# Patient Record
Sex: Male | Born: 1975
Health system: Southern US, Community
[De-identification: ages and names within clinical notes are randomized; demographics above are authoritative.]

## PROBLEM LIST (undated history)

## (undated) DIAGNOSIS — I10 Essential (primary) hypertension: Secondary | ICD-10-CM

## (undated) HISTORY — PX: TESTICLE REMOVAL: SHX68

## (undated) HISTORY — PX: HERNIA REPAIR: SHX51

---

## 2005-10-16 ENCOUNTER — Emergency Department (HOSPITAL_COMMUNITY): Admission: EM | Admit: 2005-10-16 | Discharge: 2005-10-16 | Payer: Self-pay | Admitting: Emergency Medicine

## 2006-10-07 ENCOUNTER — Emergency Department (HOSPITAL_COMMUNITY): Admission: EM | Admit: 2006-10-07 | Discharge: 2006-10-07 | Payer: Self-pay | Admitting: Emergency Medicine

## 2008-12-15 ENCOUNTER — Emergency Department (HOSPITAL_COMMUNITY): Admission: EM | Admit: 2008-12-15 | Discharge: 2008-12-15 | Payer: Self-pay | Admitting: Emergency Medicine

## 2010-06-05 IMAGING — CT CT PELVIS W/O CM
2 of 4 series · 14 of 32 positions shown, 19 images · non-contrast
Comparison: None

CT ABDOMEN

CLINICAL DATA: Hematuria, left flank pain

CT OF THE ABDOMEN AND PELVIS WITHOUT CONTRAST (CT UROGRAM)
TECHNIQUE: Multidetector CT imaging was performed through the
abdomen and pelvis to include the urinary tract.

[Series 2: stone <(id) w/ a & p 12 mth · axial · 0.75mm/px · z∈[-353,-18]mm · 7 of 88 slices shown, 12 images]
[im 11/88  soft-tissue]
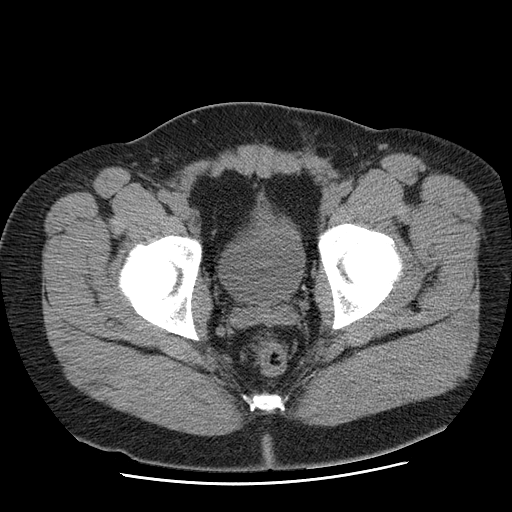
[im 11/88  bone]
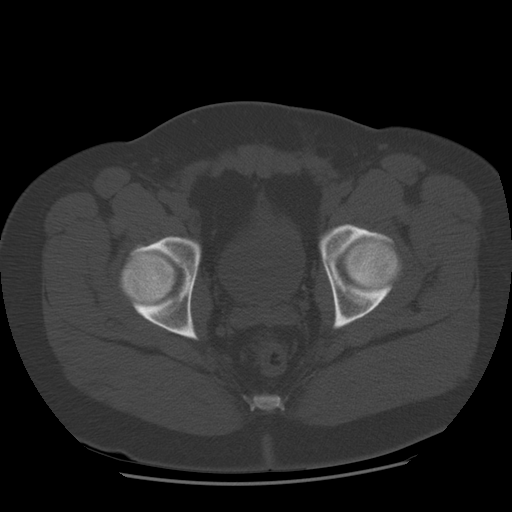
[im 22/88  soft-tissue]
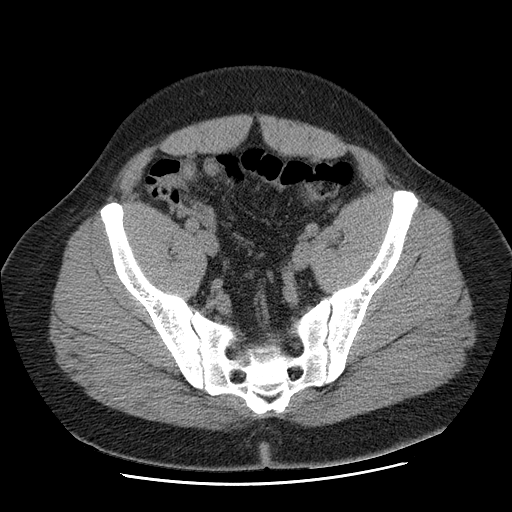
[im 33/88  soft-tissue]
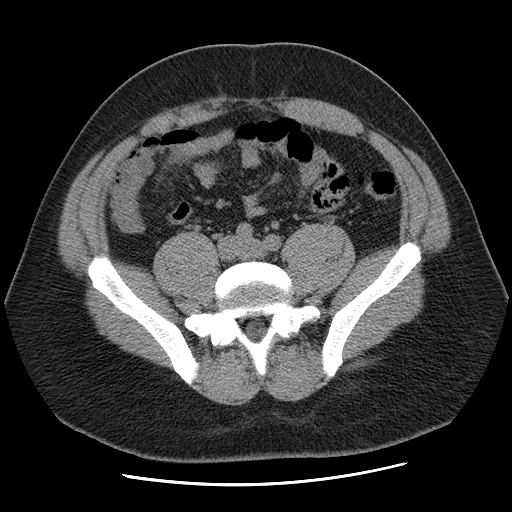
[im 44/88  soft-tissue]
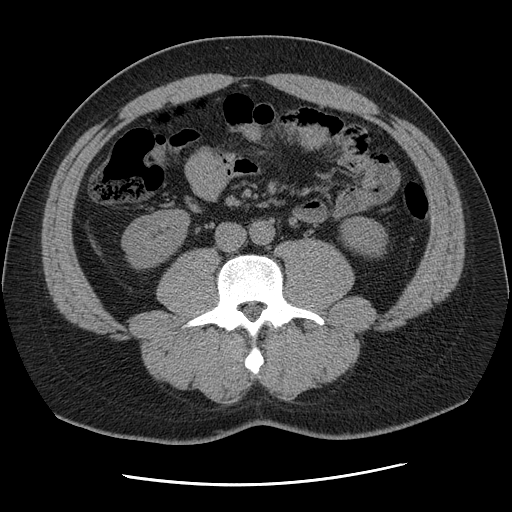
[im 44/88  lung]
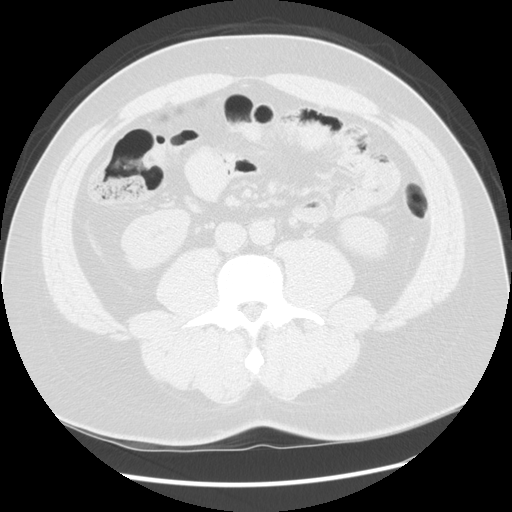
[im 55/88  soft-tissue]
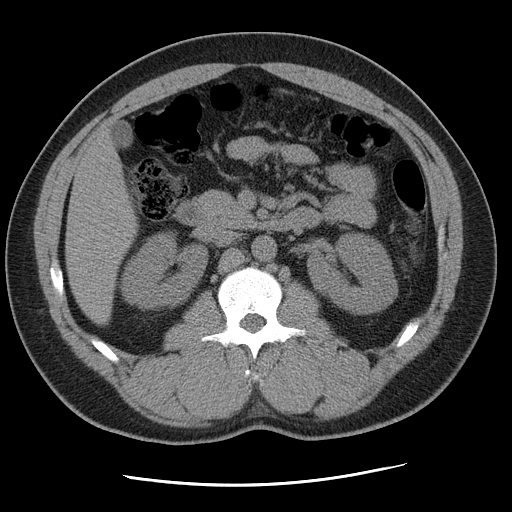
[im 55/88  lung]
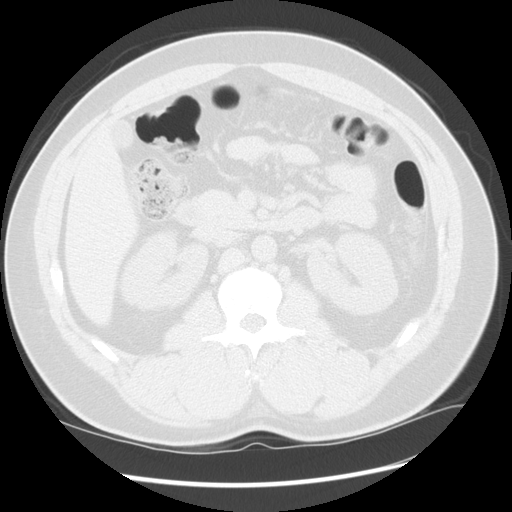
[im 66/88  soft-tissue]
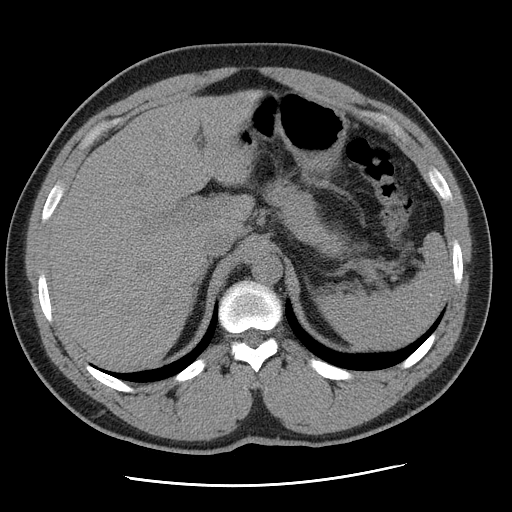
[im 66/88  lung]
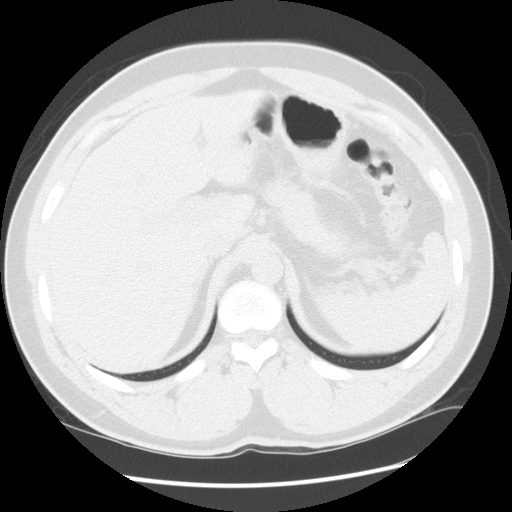
[im 77/88  soft-tissue]
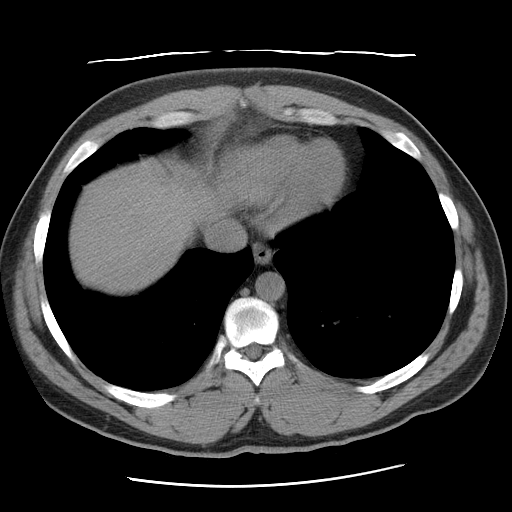
[im 77/88  lung]
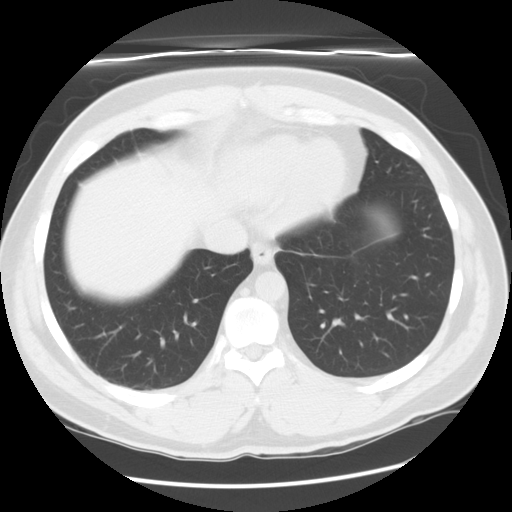

[Series 401: sagittal a/p · sagittal · 0.80mm/px · 7 of 106 slices shown]
[im 11/106  soft-tissue]
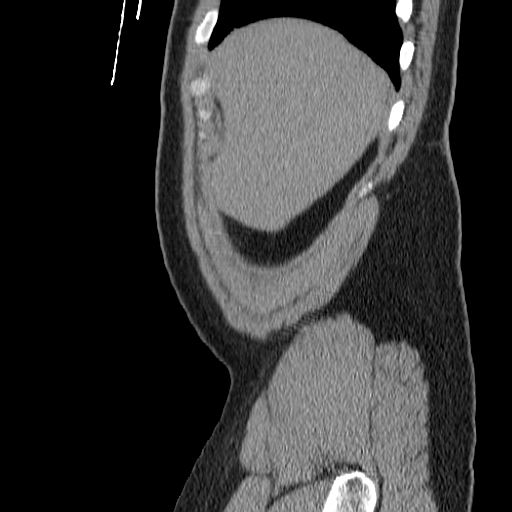
[im 22/106  soft-tissue]
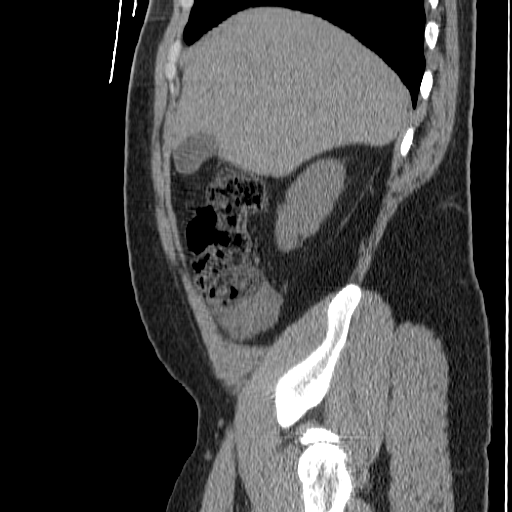
[im 32/106  soft-tissue]
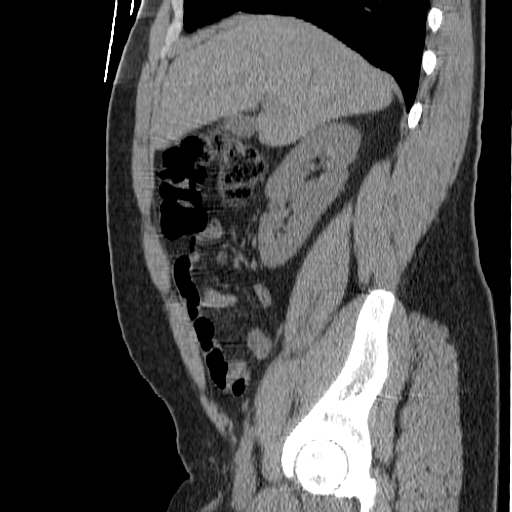
[im 43/106  soft-tissue]
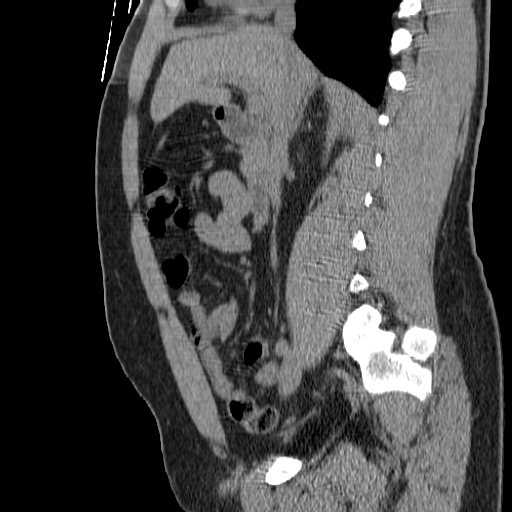
[im 64/106  soft-tissue]
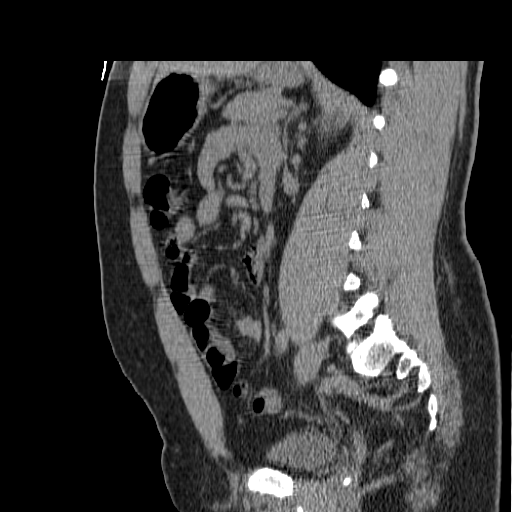
[im 74/106  soft-tissue]
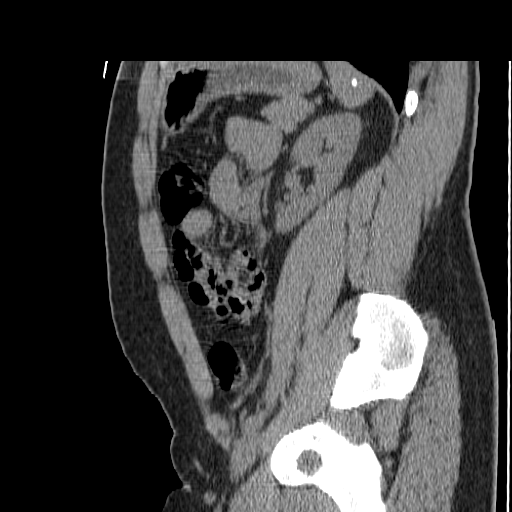
[im 85/106  soft-tissue]
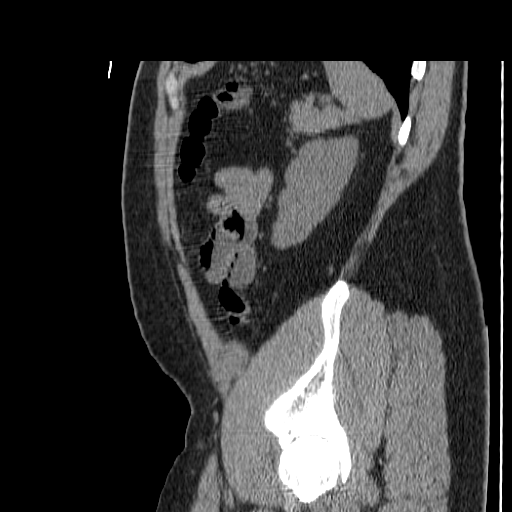

[14 of 32 positions shown; findings below may reference images not displayed]

FINDINGS: Lung bases are unremarkable.

The unenhanced liver, pancreas and adrenals are unremarkable.
Punctate calcifications are noted within spleen probable due to
prior granulomatous disease.

Unenhanced kidney are symmetrical in size.  No hydronephrosis or
hydroureter.  No nephrolithiasis.  There is a focal cortical
irregularity the posterior aspect mid pole of the left kidney
measures about 7 mm.  This may be due to scarring, small cyst or a
small angiomyolipoma.  This cannot be characterized without IV
contrast. Bilateral no ureteral calcifications are noted.

No calcified gallstones are noted within gallbladder.

No bowel obstruction.  No aortic aneurysm.  No ascites or free air.
No adenopathy.  Normal appendix is clearly visualized in the right
lower abdomen see image 48/96.
IMPRESSION: 1.  No nephrolithiasis.  No ureteral calcifications.  No
hydronephrosis or hydroureter.
2.  Focal cortical irregularity mid pole posterior aspect of the
left kidney measures about 7 mm this may be due to scarring, small
cyst or a small lipoma.  This cannot be characterized without IV
contrast.
3.  Normal appendix.

CT PELVIS
FINDINGS: Stool noted within the rectosigmoid colon.  Left pelvic
phleboliths are noted.  No pelvic ascites or adenopathy.  No
pericecal inflammation. The urinary bladder is under distended
grossly unremarkable.  Tiny prostate gland calcifications are
noted.  No inguinal adenopathy noted.

Bilateral distal ureter is unremarkable.
IMPRESSION: 1.  No pelvic ascites or adenopathy.
2.  Unremarkable bladder and distal ureters bilaterally.

## 2010-11-19 LAB — URINALYSIS, ROUTINE W REFLEX MICROSCOPIC
Glucose, UA: NEGATIVE mg/dL
Protein, ur: NEGATIVE mg/dL
pH: 6 (ref 5.0–8.0)

## 2010-11-19 LAB — URINE MICROSCOPIC-ADD ON

## 2010-11-19 LAB — BASIC METABOLIC PANEL
CO2: 27 mEq/L (ref 19–32)
Chloride: 106 mEq/L (ref 96–112)
Glucose, Bld: 105 mg/dL — ABNORMAL HIGH (ref 70–99)
Potassium: 4.5 mEq/L (ref 3.5–5.1)
Sodium: 139 mEq/L (ref 135–145)

## 2010-11-19 LAB — URINE CULTURE: Colony Count: NO GROWTH

## 2010-11-19 LAB — GC/CHLAMYDIA PROBE AMP, GENITAL: Chlamydia, DNA Probe: NEGATIVE

## 2011-01-24 ENCOUNTER — Emergency Department (HOSPITAL_BASED_OUTPATIENT_CLINIC_OR_DEPARTMENT_OTHER)
Admission: EM | Admit: 2011-01-24 | Discharge: 2011-01-24 | Disposition: A | Discharge status: Home / Self Care | Payer: Self-pay | Attending: Emergency Medicine | Admitting: Emergency Medicine

## 2011-01-24 DIAGNOSIS — J069 Acute upper respiratory infection, unspecified: Secondary | ICD-10-CM | POA: Insufficient documentation

## 2011-01-24 DIAGNOSIS — J45909 Unspecified asthma, uncomplicated: Secondary | ICD-10-CM | POA: Insufficient documentation

## 2012-04-25 ENCOUNTER — Emergency Department (HOSPITAL_BASED_OUTPATIENT_CLINIC_OR_DEPARTMENT_OTHER)
Admission: EM | Admit: 2012-04-25 | Discharge: 2012-04-25 | Disposition: A | Discharge status: Home / Self Care | Payer: Self-pay | Attending: Emergency Medicine | Admitting: Emergency Medicine

## 2012-04-25 ENCOUNTER — Emergency Department (HOSPITAL_BASED_OUTPATIENT_CLINIC_OR_DEPARTMENT_OTHER): Payer: Self-pay

## 2012-04-25 ENCOUNTER — Encounter (HOSPITAL_BASED_OUTPATIENT_CLINIC_OR_DEPARTMENT_OTHER): Payer: Self-pay | Admitting: *Deleted

## 2012-04-25 DIAGNOSIS — R51 Headache: Secondary | ICD-10-CM

## 2012-04-25 DIAGNOSIS — J329 Chronic sinusitis, unspecified: Secondary | ICD-10-CM | POA: Insufficient documentation

## 2012-04-25 MED ORDER — AMOXICILLIN-POT CLAVULANATE 875-125 MG PO TABS: 1.0000 | ORAL_TABLET | Freq: Two times a day (BID) | ORAL | Status: AC

## 2012-04-25 MED ORDER — ACETAMINOPHEN 325 MG PO TABS
650.0000 mg | ORAL_TABLET | Freq: Once | ORAL | Status: AC
  Administered 2012-04-25: 650 mg via ORAL
  Filled 2012-04-25: qty 2

## 2012-04-25 MED ORDER — IBUPROFEN 800 MG PO TABS
800.0000 mg | ORAL_TABLET | Freq: Once | ORAL | Status: AC
  Administered 2012-04-25: 800 mg via ORAL
  Filled 2012-04-25: qty 1

## 2012-04-25 NOTE — ED Notes (Signed)
Patient c/o ha over the past two weeks, takes ibuprofen , was informed he had high blood pressure last year, but has not seen an MD for management, concerned Ha due to elevated BP,

## 2012-04-25 NOTE — ED Provider Notes (Signed)
History     CSN: [id number]  Arrival date & time 04/25/12  1209   First MD Initiated Contact with Patient 04/25/12 1218      No chief complaint on file.   (Consider location/radiation/quality/duration/timing/severity/associated sxs/prior treatment) HPI  Patient complaint of pain to back of head for a week.  [place] and ate a lot of salty chips and thinks this caused elevated bp.  No injury to head.  Describes as tight behind bilateral ears then radiates to bilateral temples.  Pain is present in am and gradually goes away through day.  Pain is moderate.  No history of headaches or fever.  No history of hypertension.    No past medical history on file.  No past surgical history on file.  No family history on file.  History  Substance Use Topics  . Smoking status: Not on file  . Smokeless tobacco: Not on file  . Alcohol Use: Not on file      Review of Systems  Constitutional: Negative for fever and chills.  HENT: Negative for neck stiffness.   Eyes: Negative for visual disturbance.  Respiratory: Negative for shortness of breath.   Cardiovascular: Negative for chest pain.  Gastrointestinal: Negative for vomiting, diarrhea and blood in stool.  Genitourinary: Negative for dysuria, frequency and decreased urine volume.  Musculoskeletal: Negative for myalgias and joint swelling.  Skin: Negative for rash.  Neurological: Negative for weakness.  Hematological: Negative for adenopathy.  Psychiatric/Behavioral: Negative for agitation.    Allergies  Review of patient's allergies indicates not on file.  Home Medications  No current outpatient prescriptions on file.  BP 147/87  Pulse 84  Temp 98.1 F (36.7 C) (Oral)  Resp 16  Ht 6\' 3"  (1.905 m)  Wt 245 lb (111.131 kg)  BMI 30.62 kg/m2  SpO2 98%  Physical Exam  Nursing note and vitals reviewed. Constitutional: He is oriented to person, place, and time. He appears well-developed  and well-nourished.  HENT:  Head: Normocephalic and atraumatic.  Right Ear: External ear normal.  Left Ear: External ear normal.  Nose: Nose normal.  Mouth/Throat: Oropharynx is clear and moist.  Eyes: Conjunctivae normal and EOM are normal. Pupils are equal, round, and reactive to light.  Neck: Normal range of motion. Neck supple.  Cardiovascular: Normal rate, regular rhythm, normal heart sounds and intact distal pulses.   Pulmonary/Chest: Effort normal and breath sounds normal. No respiratory distress. He has no wheezes. He exhibits no tenderness.  Abdominal: Soft. Bowel sounds are normal. He exhibits no distension and no mass. There is no tenderness. There is no guarding.  Musculoskeletal: Normal range of motion.  Neurological: He is alert and oriented to person, place, and time. He has normal reflexes. He exhibits normal muscle tone. Coordination normal.  Skin: Skin is warm and dry.  Psychiatric: He has a normal mood and affect. His behavior is normal. Judgment and thought content normal.    ED Course  Procedures (including critical care time)  Labs Reviewed - No data to display No results found.   No diagnosis found.  Ct Head Wo Contrast  04/25/2012  *RADIOLOGY REPORT*  Clinical Data: Headaches for 2 weeks.  CT HEAD WITHOUT CONTRAST  Technique:  Contiguous axial images were obtained from the base of the skull through the vertex without contrast.  Comparison: None.  Findings: There is no evidence of acute intracranial hemorrhage, mass lesion, brain edema or extra-axial fluid collection.  The ventricles and  subarachnoid spaces are appropriately sized for age. There is no CT evidence of acute cortical infarction.  There is nodular mucosal thickening throughout the ethmoid and maxillary sinuses bilaterally.  The visualized sphenoid and frontal sinuses are clear.  No air-fluid levels are seen.  The mastoids and middle ears are clear. The calvarium is intact.  IMPRESSION:  1.  No acute  intracranial findings. 2.  Ethmoid and maxillary sinus mucosal thickening suggesting chronic sinusitis.   Original Report Authenticated By: Gerrianne Scale, M.D.     MDM  Sinusitis seen on ct- will tx with afrin and augmentin        Hilario Quarry, MD 04/25/12 1310

## 2012-11-07 ENCOUNTER — Encounter (HOSPITAL_BASED_OUTPATIENT_CLINIC_OR_DEPARTMENT_OTHER): Payer: Self-pay | Admitting: *Deleted

## 2012-11-07 ENCOUNTER — Emergency Department (HOSPITAL_BASED_OUTPATIENT_CLINIC_OR_DEPARTMENT_OTHER)
Admission: EM | Admit: 2012-11-07 | Discharge: 2012-11-07 | Disposition: A | Discharge status: Home / Self Care | Payer: Self-pay | Attending: Emergency Medicine | Admitting: Emergency Medicine

## 2012-11-07 DIAGNOSIS — J029 Acute pharyngitis, unspecified: Secondary | ICD-10-CM | POA: Insufficient documentation

## 2012-11-07 DIAGNOSIS — R509 Fever, unspecified: Secondary | ICD-10-CM | POA: Insufficient documentation

## 2012-11-07 DIAGNOSIS — F17200 Nicotine dependence, unspecified, uncomplicated: Secondary | ICD-10-CM | POA: Insufficient documentation

## 2012-11-07 MED ORDER — AMOXICILLIN 500 MG PO CAPS: 500.0000 mg | ORAL_CAPSULE | Freq: Three times a day (TID) | ORAL | Status: DC

## 2012-11-07 MED ORDER — PREDNISONE 50 MG PO TABS
60.0000 mg | ORAL_TABLET | Freq: Once | ORAL | Status: AC
  Administered 2012-11-07: 60 mg via ORAL
  Filled 2012-11-07: qty 1

## 2012-11-07 NOTE — ED Notes (Signed)
Throat sore for the past two days, hurts to swallow, took ibuprofen, used throat spray & salt water, no relief

## 2012-11-07 NOTE — ED Provider Notes (Signed)
History     CSN: [id number]  Arrival date & time 11/07/12  0715   First MD Initiated Contact with Patient 11/07/12 [phone]      Chief Complaint  Patient presents with  . Sore Throat     Patient is a 37 y.o. male presenting with pharyngitis. The history is provided by the patient.  Sore Throat This is a new problem. The current episode started 2 days ago. The problem occurs constantly. The problem has been gradually worsening. Pertinent negatives include no shortness of breath. The symptoms are aggravated by swallowing. The symptoms are relieved by rest. Treatments tried: ibuprofen. The treatment provided no relief.    PMH - none  Past Surgical History  Procedure Laterality Date  . Hernia repair    . Testicle removal      No family history on file.  History  Substance Use Topics  . Smoking status: Current Every Day Smoker  . Smokeless tobacco: Not on file  . Alcohol Use: Yes      Review of Systems  Constitutional: Positive for fever.  Respiratory: Negative for shortness of breath.     Allergies  Review of patient's allergies indicates no known allergies.  Home Medications   Current Outpatient Rx  Name  Route  Sig  Dispense  Refill  . amoxicillin (AMOXIL) 500 MG capsule   Oral   Take 1 capsule (500 mg total) by mouth 3 (three) times daily.   21 capsule   0   . ibuprofen (ADVIL,MOTRIN) 200 MG tablet   Oral   Take 200 mg by mouth as needed.           BP 134/87  Pulse 87  Temp(Src) 99.1 F (37.3 C) (Oral)  Resp 18  SpO2 98%  Physical Exam CONSTITUTIONAL: Well developed/well nourished HEAD: Normocephalic/atraumatic EYES: EOMI/PERRL ENMT: Mucous membranes moist, uvula midline.  Exudates noted with erythema noted in posterior oropharynx.  No trismus.  Voice not muffled NECK: supple no meningeal signs SPINE:entire spine nontender CV: S1/S2 noted, no murmurs/rubs/gallops noted LUNGS: Lungs are clear to auscultation bilaterally, no apparent  distress ABDOMEN: soft, nontender, no rebound or guarding NEURO: Pt is awake/alert, moves all extremitiesx4 EXTREMITIES: pulses normal, full ROM SKIN: warm, color normal PSYCH: no abnormalities of mood noted  ED Course  Procedures  1. Pharyngitis       MDM  Nursing notes including past medical history and social history reviewed and considered in documentation  Will treat empirically for strep pharyngitis Stable for d/c        Joya Gaskins, MD 11/07/12 [phone]

## 2013-08-12 ENCOUNTER — Emergency Department (HOSPITAL_BASED_OUTPATIENT_CLINIC_OR_DEPARTMENT_OTHER)
Admission: EM | Admit: 2013-08-12 | Discharge: 2013-08-12 | Disposition: A | Discharge status: Home / Self Care | Payer: Self-pay | Attending: Emergency Medicine | Admitting: Emergency Medicine

## 2013-08-12 ENCOUNTER — Emergency Department (HOSPITAL_BASED_OUTPATIENT_CLINIC_OR_DEPARTMENT_OTHER): Payer: Self-pay

## 2013-08-12 ENCOUNTER — Encounter (HOSPITAL_BASED_OUTPATIENT_CLINIC_OR_DEPARTMENT_OTHER): Payer: Self-pay | Admitting: Emergency Medicine

## 2013-08-12 DIAGNOSIS — J029 Acute pharyngitis, unspecified: Secondary | ICD-10-CM | POA: Insufficient documentation

## 2013-08-12 DIAGNOSIS — F17200 Nicotine dependence, unspecified, uncomplicated: Secondary | ICD-10-CM | POA: Insufficient documentation

## 2013-08-12 DIAGNOSIS — R509 Fever, unspecified: Secondary | ICD-10-CM | POA: Insufficient documentation

## 2013-08-12 DIAGNOSIS — R69 Illness, unspecified: Secondary | ICD-10-CM

## 2013-08-12 DIAGNOSIS — J111 Influenza due to unidentified influenza virus with other respiratory manifestations: Secondary | ICD-10-CM | POA: Insufficient documentation

## 2013-08-12 MED ORDER — GUAIFENESIN-CODEINE 100-10 MG/5ML PO SOLN: 10.0000 mL | Freq: Four times a day (QID) | ORAL | Status: DC | PRN

## 2013-08-12 NOTE — ED Notes (Signed)
Patient transported to X-ray 

## 2013-08-12 NOTE — ED Provider Notes (Signed)
CSN: [id number]     Arrival date & time 08/12/13  [id number] History   First MD Initiated Contact with Patient 08/12/13 0732     Chief Complaint  Patient presents with  . Cough   (Consider location/radiation/quality/duration/timing/severity/associated sxs/prior Treatment) Patient is a 38 y.o. male presenting with cough. The history is provided by the patient.  Cough Cough characteristics:  Non-productive Severity:  Moderate Onset quality:  Sudden Duration:  2 days Timing:  Constant Progression:  Worsening Chronicity:  New Smoker: no   Context: sick contacts   Relieved by:  Nothing Worsened by:  Nothing tried Ineffective treatments:  None tried Associated symptoms: chills, fever and sore throat   Associated symptoms: no shortness of breath     History reviewed. No pertinent past medical history. Past Surgical History  Procedure Laterality Date  . Hernia repair    . Testicle removal     History reviewed. No pertinent family history. History  Substance Use Topics  . Smoking status: Current Every Day Smoker  . Smokeless tobacco: Not on file  . Alcohol Use: Yes    Review of Systems  Constitutional: Positive for fever and chills.  HENT: Positive for sore throat.   Respiratory: Positive for cough. Negative for shortness of breath.   All other systems reviewed and are negative.    Allergies  Review of patient's allergies indicates no known allergies.  Home Medications   Current Outpatient Rx  Name  Route  Sig  Dispense  Refill  . amoxicillin (AMOXIL) 500 MG capsule   Oral   Take 1 capsule (500 mg total) by mouth 3 (three) times daily.   21 capsule   0   . ibuprofen (ADVIL,MOTRIN) 200 MG tablet   Oral   Take 200 mg by mouth as needed.          BP 147/97  Pulse 94  Temp(Src) 98.8 F (37.1 C) (Oral)  Resp 20  Wt 281 lb (127.461 kg)  SpO2 97% Physical Exam  Nursing note and vitals reviewed. Constitutional: He is oriented to person, place, and time. He appears  well-developed and well-nourished. No distress.  HENT:  Head: Normocephalic and atraumatic.  Mouth/Throat: Oropharynx is clear and moist.  Neck: Normal range of motion. Neck supple.  Cardiovascular: Normal rate, regular rhythm and normal heart sounds.   No murmur heard. Pulmonary/Chest: Effort normal and breath sounds normal. No respiratory distress. He has no wheezes.  Abdominal: Soft. Bowel sounds are normal. He exhibits no distension. There is no tenderness.  Musculoskeletal: Normal range of motion. He exhibits no edema.  Lymphadenopathy:    He has no cervical adenopathy.  Neurological: He is alert and oriented to person, place, and time.  Skin: Skin is warm and dry. He is not diaphoretic.    ED Course  Procedures (including critical care time) Labs Review Labs Reviewed - No data to display Imaging Review Dg Chest 2 View  08/12/2013   CLINICAL DATA:  Cough, fatigue, chills.  EXAM: CHEST  2 VIEW  COMPARISON:  None.  FINDINGS: The heart size and mediastinal contours are within normal limits. Both lungs are clear. The visualized skeletal structures are unremarkable.  IMPRESSION: No active cardiopulmonary disease.   Electronically Signed   By: Charlett NoseKevin  Dover M.D.   On: 08/12/2013 07:22      MDM  No diagnosis found. Patient is a 38 year old male who presents with complaints of cough, generalized body aches, headache, and fever for the past several days. This appears to be  an influenza-like illness. Chest x-ray is negative for pneumonia. We'll treat with Robitussin with codeine, Tylenol and Motrin, and followup when necessary if he develops any new or concerning symptoms.    Geoffery Lyons, MD 08/12/13 [phone]

## 2013-08-12 NOTE — Discharge Instructions (Signed)
Robitussin with codeine as prescribed as needed for cough.  Tylenol 1000 mg rotated with Motrin 600 mg every 3 hours as needed for pain or fever.  Return to the emergency department if you develop chest pain, difficulty breathing, or other new or concerning symptoms.   Influenza, Adult Influenza ("the flu") is a viral infection of the respiratory tract. It occurs more often in winter months because people spend more time in close contact with one another. Influenza can make you feel very sick. Influenza easily spreads from person to person (contagious). CAUSES  Influenza is caused by a virus that infects the respiratory tract. You can catch the virus by breathing in droplets from an infected person's cough or sneeze. You can also catch the virus by touching something that was recently contaminated with the virus and then touching your mouth, nose, or eyes. SYMPTOMS  Symptoms typically last 4 to 10 days and may include:  Fever.  Chills.  Headache, body aches, and muscle aches.  Sore throat.  Chest discomfort and cough.  Poor appetite.  Weakness or feeling tired.  Dizziness.  Nausea or vomiting. DIAGNOSIS  Diagnosis of influenza is often made based on your history and a physical exam. A nose or throat swab test can be done to confirm the diagnosis. RISKS AND COMPLICATIONS You may be at risk for a more severe case of influenza if you smoke cigarettes, have diabetes, have chronic heart disease (such as heart failure) or lung disease (such as asthma), or if you have a weakened immune system. Elderly people and pregnant women are also at risk for more serious infections. The most common complication of influenza is a lung infection (pneumonia). Sometimes, this complication can require emergency medical care and may be life-threatening. PREVENTION  An annual influenza vaccination (flu shot) is the best way to avoid getting influenza. An annual flu shot is now routinely recommended for  all adults in the [place] TREATMENT  In mild cases, influenza goes away on its own. Treatment is directed at relieving symptoms. For more severe cases, your caregiver may prescribe antiviral medicines to shorten the sickness. Antibiotic medicines are not effective, because the infection is caused by a virus, not by bacteria. HOME CARE INSTRUCTIONS  Only take over-the-counter or prescription medicines for pain, discomfort, or fever as directed by your caregiver.  Use a cool mist humidifier to make breathing easier.  Get plenty of rest until your temperature returns to normal. This usually takes 3 to 4 days.  Drink enough fluids to keep your urine clear or pale yellow.  Cover your mouth and nose when coughing or sneezing, and wash your hands well to avoid spreading the virus.  Stay home from work or school until your fever has been gone for at least 1 full day. SEEK MEDICAL CARE IF:   You have chest pain or a deep cough that worsens or produces more mucus.  You have nausea, vomiting, or diarrhea. SEEK IMMEDIATE MEDICAL CARE IF:   You have difficulty breathing, shortness of breath, or your skin or nails turn bluish.  You have severe neck pain or stiffness.  You have a severe headache, facial pain, or earache.  You have a worsening or recurring fever.  You have nausea or vomiting that cannot be controlled. MAKE SURE YOU:  Understand these instructions.  Will watch your condition.  Will get help right away if you are not doing well or get worse. Document Released: 07/25/2000 Document Revised: 01/27/2012 Document Reviewed: 10/27/2011 [employer] Patient Information  2014 [employer]. ° °

## 2013-08-12 NOTE — ED Notes (Signed)
Cough, fatigue, chills, and sore throat x 2 days. No relief from OTC meds.

## 2014-08-21 ENCOUNTER — Emergency Department (HOSPITAL_BASED_OUTPATIENT_CLINIC_OR_DEPARTMENT_OTHER)
Admission: EM | Admit: 2014-08-21 | Discharge: 2014-08-22 | Disposition: A | Discharge status: Home / Self Care | Payer: Self-pay | Attending: Emergency Medicine | Admitting: Emergency Medicine

## 2014-08-21 ENCOUNTER — Encounter (HOSPITAL_BASED_OUTPATIENT_CLINIC_OR_DEPARTMENT_OTHER): Payer: Self-pay | Admitting: *Deleted

## 2014-08-21 DIAGNOSIS — Z720 Tobacco use: Secondary | ICD-10-CM | POA: Insufficient documentation

## 2014-08-21 DIAGNOSIS — N12 Tubulo-interstitial nephritis, not specified as acute or chronic: Secondary | ICD-10-CM | POA: Insufficient documentation

## 2014-08-21 DIAGNOSIS — Z792 Long term (current) use of antibiotics: Secondary | ICD-10-CM | POA: Insufficient documentation

## 2014-08-21 DIAGNOSIS — R52 Pain, unspecified: Secondary | ICD-10-CM

## 2014-08-21 LAB — URINE MICROSCOPIC-ADD ON

## 2014-08-21 LAB — URINALYSIS, ROUTINE W REFLEX MICROSCOPIC
BILIRUBIN URINE: NEGATIVE
Glucose, UA: NEGATIVE mg/dL
KETONES UR: NEGATIVE mg/dL
Nitrite: NEGATIVE
PROTEIN: NEGATIVE mg/dL
Specific Gravity, Urine: 1.024 (ref 1.005–1.030)
UROBILINOGEN UA: 0.2 mg/dL (ref 0.0–1.0)
pH: 6.5 (ref 5.0–8.0)

## 2014-08-21 NOTE — ED Notes (Addendum)
C/o R back pain, R abd pain, dizziness, straining with urination. (denies: fever, nvd, sob or CP), took ibuprofen 800mg  at Sunday at 1800 w/o some minimal temporary relief. No meds PTA. No h/o kidney stones.

## 2014-08-22 ENCOUNTER — Emergency Department (HOSPITAL_BASED_OUTPATIENT_CLINIC_OR_DEPARTMENT_OTHER): Payer: Self-pay

## 2014-08-22 LAB — CBC WITH DIFFERENTIAL/PLATELET
BASOS ABS: 0 10*3/uL (ref 0.0–0.1)
BASOS PCT: 0 % (ref 0–1)
EOS ABS: 0.5 10*3/uL (ref 0.0–0.7)
EOS PCT: 7 % — AB (ref 0–5)
HEMATOCRIT: 41.6 % (ref 39.0–52.0)
HEMOGLOBIN: 13.5 g/dL (ref 13.0–17.0)
Lymphocytes Relative: 34 % (ref 12–46)
Lymphs Abs: 2.3 10*3/uL (ref 0.7–4.0)
MCH: 28.4 pg (ref 26.0–34.0)
MCHC: 32.5 g/dL (ref 30.0–36.0)
MCV: 87.6 fL (ref 78.0–100.0)
MONO ABS: 0.8 10*3/uL (ref 0.1–1.0)
Monocytes Relative: 11 % (ref 3–12)
Neutro Abs: 3.2 10*3/uL (ref 1.7–7.7)
Neutrophils Relative %: 48 % (ref 43–77)
PLATELETS: 262 10*3/uL (ref 150–400)
RBC: 4.75 MIL/uL (ref 4.22–5.81)
RDW: 13.3 % (ref 11.5–15.5)
WBC: 6.7 10*3/uL (ref 4.0–10.5)

## 2014-08-22 LAB — BASIC METABOLIC PANEL
ANION GAP: 5 (ref 5–15)
BUN: 15 mg/dL (ref 6–23)
CHLORIDE: 104 meq/L (ref 96–112)
CO2: 29 mmol/L (ref 19–32)
CREATININE: 1.17 mg/dL (ref 0.50–1.35)
Calcium: 8.7 mg/dL (ref 8.4–10.5)
GFR, EST AFRICAN AMERICAN: 89 mL/min — AB (ref >90)
GFR, EST NON AFRICAN AMERICAN: 77 mL/min — AB (ref >90)
Glucose, Bld: 102 mg/dL — ABNORMAL HIGH (ref 70–99)
Potassium: 3.9 mmol/L (ref 3.5–5.1)
Sodium: 138 mmol/L (ref 135–145)

## 2014-08-22 MED ORDER — FENTANYL CITRATE 0.05 MG/ML IJ SOLN
100.0000 ug | Freq: Once | INTRAMUSCULAR | Status: AC
  Administered 2014-08-22: 100 ug via INTRAVENOUS
  Filled 2014-08-22: qty 2

## 2014-08-22 MED ORDER — CIPROFLOXACIN HCL 500 MG PO TABS
500.0000 mg | ORAL_TABLET | Freq: Once | ORAL | Status: AC
  Administered 2014-08-22: 500 mg via ORAL
  Filled 2014-08-22: qty 1

## 2014-08-22 MED ORDER — CIPROFLOXACIN HCL 500 MG PO TABS: 500.0000 mg | ORAL_TABLET | Freq: Two times a day (BID) | ORAL | Status: DC

## 2014-08-22 MED ORDER — HYDROCODONE-ACETAMINOPHEN 5-325 MG PO TABS
1.0000 | ORAL_TABLET | Freq: Four times a day (QID) | ORAL | Status: DC | PRN
Start: 2014-08-22 — End: 2015-12-25

## 2014-08-22 NOTE — ED Notes (Signed)
MD at bedside. 

## 2014-08-22 NOTE — ED Notes (Signed)
Patient transported to CT via stretcher per tech. 

## 2014-08-22 NOTE — ED Provider Notes (Addendum)
CSN: [id number]     Arrival date & time 08/21/14  2221 History  This chart was scribed for Hanley Seamen, MD by Murriel Hopper, ED Scribe. This patient was seen in room MH10/MH10 and the patient's care was started at 12:34 AM.    Chief Complaint  Patient presents with  . Abdominal Pain      The history is provided by the patient. No language interpreter was used.     HPI Comments: Johnny Lawrence is a 39 y.o. male who presents to the Emergency Department complaining of intermittent, worsening lower abdominal pain with associated intermittent right flank pain, dysuria, light-headedness and nausea that have been present for two days. Pt states that he begins to have right flank and lower abdominal pain whenever he urinates, and notes a burning sensation while urinating. Pt states that pain level is 6-7/10 in those areas while urinating. Pt denies hematuria, fever, chills, vomiting, diarrhea or penile discharge.    History reviewed. No pertinent past medical history. Past Surgical History  Procedure Laterality Date  . Hernia repair    . Testicle removal Right    History reviewed. No pertinent family history. History  Substance Use Topics  . Smoking status: Current Every Day Smoker  . Smokeless tobacco: Not on file  . Alcohol Use: Yes    Review of Systems  Constitutional: Negative for fever and chills.  Gastrointestinal: Positive for nausea and abdominal pain. Negative for vomiting and diarrhea.  Genitourinary: Positive for dysuria. Negative for discharge.  Musculoskeletal: Positive for back pain.  Neurological: Positive for light-headedness.    Allergies  Review of patient's allergies indicates no known allergies.  Home Medications   Prior to Admission medications   Medication Sig Start Date End Date Taking? Authorizing Provider  amoxicillin (AMOXIL) 500 MG capsule Take 1 capsule (500 mg total) by mouth 3 (three) times daily. 11/07/12   Joya Gaskins, MD   guaiFENesin-codeine 100-10 MG/5ML syrup Take 10 mLs by mouth every 6 (six) hours as needed for cough. 08/12/13   Geoffery Lyons, MD  ibuprofen (ADVIL,MOTRIN) 200 MG tablet Take 200 mg by mouth as needed.    Historical Provider, MD   BP 140/82 mmHg  Pulse 72  Temp(Src) 98.4 F (36.9 C) (Oral)  Resp 16  Ht  (1.905 m)  Wt 289 lb (131.09 kg)  BMI 36.12 kg/m2  SpO2 95%   Physical Exam  General: Well-developed, well-nourished male in no acute distress; appearance consistent with age of record HENT: normocephalic; atraumatic Eyes: pupils equal, round and reactive to light; extraocular muscles intact Neck: supple Heart: regular rate and rhythm; no murmurs, rubs or gallops Lungs: clear to auscultation bilaterally Abdomen: soft; nondistended; no masses or hepatosplenomegaly; bowel sounds present; RLQ tenderness Extremities: No deformity; full range of motion; pulses normal Neurologic: Awake, alert and oriented; motor function intact in all extremities and symmetric; no facial droop Skin: Warm and dry Psychiatric: Normal mood and affect GU: mild right CVA tenderness; no urethral discharge; surgical absence of left testicle  ED Course  Procedures (including critical care time)   DIAGNOSTIC STUDIES: Oxygen Saturation is 98% on RA, normal by my interpretation.    COORDINATION OF CARE: 12:38 AM Discussed treatment plan with pt at bedside and pt agreed to plan.  MDM   Nursing notes and vitals signs, including pulse oximetry, reviewed.  Summary of this visit's results, reviewed by myself:  Labs:  Results for orders placed or performed during the hospital encounter of 08/21/14 (from the  past 24 hour(s))  Urinalysis, Routine w reflex microscopic     Status: Abnormal   Collection Time: 08/21/14 10:40 PM  Result Value Ref Range   Color, Urine YELLOW YELLOW   APPearance CLEAR CLEAR   Specific Gravity, Urine 1.024 1.005 - 1.030   pH 6.5 5.0 - 8.0   Glucose, UA NEGATIVE NEGATIVE mg/dL    Hgb urine dipstick TRACE (A) NEGATIVE   Bilirubin Urine NEGATIVE NEGATIVE   Ketones, ur NEGATIVE NEGATIVE mg/dL   Protein, ur NEGATIVE NEGATIVE mg/dL   Urobilinogen, UA 0.2 0.0 - 1.0 mg/dL   Nitrite NEGATIVE NEGATIVE   Leukocytes, UA SMALL (A) NEGATIVE  Urine microscopic-add on     Status: Abnormal   Collection Time: 08/21/14 10:40 PM  Result Value Ref Range   Squamous Epithelial / LPF FEW (A) RARE   WBC, UA 7-10 <3 WBC/hpf   RBC / HPF 0-2 <3 RBC/hpf  CBC with Differential     Status: Abnormal   Collection Time: 08/22/14 12:50 AM  Result Value Ref Range   WBC 6.7 4.0 - 10.5 K/uL   RBC 4.75 4.22 - 5.81 MIL/uL   Hemoglobin 13.5 13.0 - 17.0 g/dL   HCT 16.141.6 09.639.0 - 04.552.0 %   MCV 87.6 78.0 - 100.0 fL   MCH 28.4 26.0 - 34.0 pg   MCHC 32.5 30.0 - 36.0 g/dL   RDW 40.913.3 81.111.5 - 91.415.5 %   Platelets 262 150 - 400 K/uL   Neutrophils Relative % 48 43 - 77 %   Neutro Abs 3.2 1.7 - 7.7 K/uL   Lymphocytes Relative 34 12 - 46 %   Lymphs Abs 2.3 0.7 - 4.0 K/uL   Monocytes Relative 11 3 - 12 %   Monocytes Absolute 0.8 0.1 - 1.0 K/uL   Eosinophils Relative 7 (H) 0 - 5 %   Eosinophils Absolute 0.5 0.0 - 0.7 K/uL   Basophils Relative 0 0 - 1 %   Basophils Absolute 0.0 0.0 - 0.1 K/uL  Basic metabolic panel     Status: Abnormal   Collection Time: 08/22/14 12:50 AM  Result Value Ref Range   Sodium 138 135 - 145 mmol/L   Potassium 3.9 3.5 - 5.1 mmol/L   Chloride 104 96 - 112 mEq/L   CO2 29 19 - 32 mmol/L   Glucose, Bld 102 (H) 70 - 99 mg/dL   BUN 15 6 - 23 mg/dL   Creatinine, Ser 7.821.17 0.50 - 1.35 mg/dL   Calcium 8.7 8.4 - 95.610.5 mg/dL   GFR calc non Af Amer 77 (L) >90 mL/min   GFR calc Af Amer 89 (L) >90 mL/min   Anion gap 5 5 - 15    Imaging Studies: Ct Renal Stone Study  08/22/2014   CLINICAL DATA:  Acute onset of right flank pain, dysuria, lightheadedness and nausea. Initial encounter.  EXAM: CT ABDOMEN AND PELVIS WITHOUT CONTRAST  TECHNIQUE: Multidetector CT imaging of the abdomen and  pelvis was performed following the standard protocol without IV contrast.  COMPARISON:  CT of the abdomen and pelvis performed 12/15/2008  FINDINGS: The visualized lung bases are clear.  A small 0.9 cm cyst is noted at the hepatic dome. Scattered calcified granulomata are seen within the spleen, and a single calcified granuloma is noted in the periphery of the liver. The liver and spleen are otherwise unremarkable. The gallbladder is within normal limits. The pancreas and adrenal glands are unremarkable.  A vague 3.1 cm cyst is noted at the upper  pole of the left kidney. Nonspecific perinephric stranding is noted bilaterally. The kidneys are otherwise unremarkable in appearance. There is no evidence of hydronephrosis. No renal or ureteral stones are seen.  No free fluid is identified. The small bowel is unremarkable in appearance. The stomach is within normal limits. No acute vascular abnormalities are seen. A retroaortic left renal vein is incidentally noted.  The appendix is normal in caliber and contains air, without evidence for appendicitis. The colon is unremarkable in appearance.  The bladder is mildly distended and grossly unremarkable. The prostate remains normal in size, with scattered calcification. No inguinal lymphadenopathy is seen.  No acute osseous abnormalities are identified.  IMPRESSION: 1. No acute abnormality seen within the abdomen or pelvis. 2. Left renal and hepatic cysts noted.   Electronically Signed   By: Roanna Raider M.D.   On: 08/22/2014 01:48   1:56 AM Patient symptoms and exam are consistent with pyelonephritis. We will treat accordingly. GC chlamydia swab obtained and pending.  I personally performed the services described in this documentation, which was scribed in my presence. The recorded information has been reviewed and is accurate.    Hanley Seamen, MD 08/22/14 1478  Hanley Seamen, MD 08/22/14 [phone]

## 2014-08-23 LAB — URINE CULTURE

## 2014-08-23 LAB — GC/CHLAMYDIA PROBE AMP
CT PROBE, AMP APTIMA: NEGATIVE
GC PROBE AMP APTIMA: NEGATIVE

## 2015-06-08 ENCOUNTER — Emergency Department (HOSPITAL_BASED_OUTPATIENT_CLINIC_OR_DEPARTMENT_OTHER)
Admission: EM | Admit: 2015-06-08 | Discharge: 2015-06-08 | Disposition: A | Discharge status: Home / Self Care | Payer: Self-pay | Attending: Emergency Medicine | Admitting: Emergency Medicine

## 2015-06-08 ENCOUNTER — Encounter (HOSPITAL_BASED_OUTPATIENT_CLINIC_OR_DEPARTMENT_OTHER): Payer: Self-pay | Admitting: *Deleted

## 2015-06-08 DIAGNOSIS — Z792 Long term (current) use of antibiotics: Secondary | ICD-10-CM | POA: Insufficient documentation

## 2015-06-08 DIAGNOSIS — Z87891 Personal history of nicotine dependence: Secondary | ICD-10-CM | POA: Insufficient documentation

## 2015-06-08 DIAGNOSIS — L02415 Cutaneous abscess of right lower limb: Secondary | ICD-10-CM | POA: Insufficient documentation

## 2015-06-08 MED ORDER — LIDOCAINE HCL 2 % IJ SOLN
10.0000 mL | Freq: Once | INTRAMUSCULAR | Status: AC
  Administered 2015-06-08: 100 mg via INTRADERMAL
  Filled 2015-06-08: qty 20

## 2015-06-08 NOTE — Discharge Instructions (Signed)
Abscess °An abscess (boil or furuncle) is an infected area on or under the skin. This area is filled with yellowish-white fluid (pus) and other material (debris). °HOME CARE  °· Only take medicines as told by your doctor. °· If you were given antibiotic medicine, take it as directed. Finish the medicine even if you start to feel better. °· If gauze is used, follow your doctor's directions for changing the gauze. °· To avoid spreading the infection: °· Keep your abscess covered with a bandage. °· Wash your hands well. °· Do not share personal care items, towels, or whirlpools with others. °· Avoid skin contact with others. °· Keep your skin and clothes clean around the abscess. °· Keep all doctor visits as told. °GET HELP RIGHT AWAY IF:  °· You have more pain, puffiness (swelling), or redness in the wound site. °· You have more fluid or blood coming from the wound site. °· You have muscle aches, chills, or you feel sick. °· You have a fever. °MAKE SURE YOU:  °· Understand these instructions. °· Will watch your condition. °· Will get help right away if you are not doing well or get worse. °  °This information is not intended to replace advice given to you by your health care provider. Make sure you discuss any questions you have with your health care provider. °  °Document Released: 01/14/2008 Document Revised: 01/27/2012 Document Reviewed: 10/11/2011 °Elsevier Interactive Patient Education ©2016 Else[employer] Inc[employer] ° °Incision and Drainage °Incision and drainage is a procedure in which a sac-like structure (cystic structure) is opened and drained. The area to be drained usually contains material such as pus, fluid, or blood.  °LET YOUR CAREGIVER KNOW ABOUT:  °· Allergies to medicine. °· Medicines taken, including vitamins, herbs, eyedrops, over-the-counter medicines, and creams. °· Use of steroids (by mouth or creams). °· Previous problems with anesthetics or numbing medicines. °· History of bleeding problems or blood  clots. °· Previous surgery. °· Other health problems, including diabetes and kidney problems. °· Possibility of pregnancy, if this applies. °RISKS AND COMPLICATIONS °· Pain. °· Bleeding. °· Scarring. °· Infection. °BEFORE THE PROCEDURE  °You may need to have an ultrasound or other imaging tests to see how large or deep your cystic structure is. Blood tests may also be used to determine if you have an infection or how severe the infection is. You may need to have a tetanus shot. °PROCEDURE  °The affected area is cleaned with a cleaning fluid. The cyst area will then be numbed with a medicine (local anesthetic). A small incision will be made in the cystic structure. A syringe or catheter may be used to drain the contents of the cystic structure, or the contents may be squeezed out. The area will then be flushed with a cleansing solution. After cleansing the area, it is often gently packed with a gauze or another wound dressing. Once it is packed, it will be covered with gauze and tape or some other type of wound dressing.  °AFTER THE PROCEDURE  °· Often, you will be allowed to go home right after the procedure. °· You may be given antibiotic medicine to prevent or heal an infection. °· If the area was packed with gauze or some other wound dressing, you will likely need to come back in 1 to 2 days to get it removed. °· The area should heal in about 14 days. °  °This information is not intended to replace advice given to you by your health care provider.   Make sure you discuss any questions you have with your health care provider.   Document Released: 01/21/2001 Document Revised: 01/27/2012 Document Reviewed: 09/22/2011 Elsevier Interactive Patient Education [employer]2016 Else[employer] Inc.  Keep the packing in place. Return here for wound recheck in 2 days. Work note provided. Motrin as needed for discomfort.

## 2015-06-08 NOTE — ED Provider Notes (Signed)
CSN: [id number]     Arrival date & time 06/08/15  1478 History   First MD Initiated Contact with Patient 06/08/15 [phone]     Chief Complaint  Patient presents with  . abcess      (Consider location/radiation/quality/duration/timing/severity/associated sxs/prior Treatment) The history is provided by the patient.   patient with a history of a boil abscess has been developing on the right inner upper thigh for the past 3 days. Started draining overnight. Pain associated with the abscess rated 8 out of 10. Localized just to that area. No fevers.  History reviewed. No pertinent past medical history. Past Surgical History  Procedure Laterality Date  . Hernia repair    . Testicle removal Right    No family history on file. Social History  Substance Use Topics  . Smoking status: Former Games developer  . Smokeless tobacco: None  . Alcohol Use: Yes    Review of Systems  Constitutional: Negative for fever.  HENT: Negative for congestion.   Respiratory: Negative for shortness of breath.   Cardiovascular: Negative for chest pain.  Gastrointestinal: Negative for abdominal pain.  Genitourinary: Negative for scrotal swelling.  Skin: Positive for wound.  Neurological: Negative for headaches.  Hematological: Does not bruise/bleed easily.  Psychiatric/Behavioral: Negative for confusion.      Allergies  Review of patient's allergies indicates no known allergies.  Home Medications   Prior to Admission medications   Medication Sig Start Date End Date Taking? Authorizing Provider  ibuprofen (ADVIL,MOTRIN) 200 MG tablet Take 200 mg by mouth as needed.   Yes Historical Provider, MD  amoxicillin (AMOXIL) 500 MG capsule Take 1 capsule (500 mg total) by mouth 3 (three) times daily. 11/07/12   Zadie Rhine, MD  ciprofloxacin (CIPRO) 500 MG tablet Take 1 tablet (500 mg total) by mouth 2 (two) times daily. One po bid x 7 days 08/22/14   Paula Libra, MD  guaiFENesin-codeine 100-10 MG/5ML syrup Take 10  mLs by mouth every 6 (six) hours as needed for cough. 08/12/13   Geoffery Lyons, MD  HYDROcodone-acetaminophen (NORCO) 5-325 MG per tablet Take 1-2 tablets by mouth every 6 (six) hours as needed (for pain). 08/22/14   John Molpus, MD   BP 157/93 mmHg  Pulse 78  Temp(Src) 98.4 F (36.9 C) (Oral)  Resp 18  Ht  (1.905 m)  Wt 284 lb (128.822 kg)  BMI 35.50 kg/m2  SpO2 97% Physical Exam  Constitutional: He is oriented to person, place, and time. He appears well-developed and well-nourished. No distress.  HENT:  Head: Normocephalic and atraumatic.  Eyes: Conjunctivae and EOM are normal.  Neck: Normal range of motion.  Pulmonary/Chest: Effort normal and breath sounds normal. No respiratory distress.  Abdominal: Soft. Bowel sounds are normal. There is no tenderness.  Genitourinary:  No scrotal involvement with the abscess.  Musculoskeletal: Normal range of motion. He exhibits tenderness.  Neurological: He is alert and oriented to person, place, and time. No cranial nerve deficit. He exhibits normal muscle tone. Coordination normal.  Skin: Skin is warm. There is erythema.  Right upper inner thigh with an area of induration measuring about 5 cm. Area of fluctuance measuring about 2 cm. Some purulent drainage in portions and the skin. But not draining sufficiently. Tender to palpation.  Nursing note and vitals reviewed.   ED Course  Procedures (including critical care time) Labs Review Labs Reviewed - No data to display  Imaging Review No results found. I have personally reviewed and evaluated these images and lab  results as part of my medical decision-making.   EKG Interpretation None      INCISION AND DRAINAGE Performed by: Vanetta MuldersZACKOWSKI,Alesia Oshields Consent: Verbal consent obtained. Risks and benefits: risks, benefits and alternatives were discussed Type: abscess  Body area: Right inner upper thigh  Anesthesia: local infiltration  Incision was made with a scalpel.  Local  anesthetic: lidocaine 2% without epinephrine  Anesthetic total: 4 ml  Complexity: complex Blunt dissection to break up loculations  Drainage: purulent  Drainage amount: Moderate   Packing material: 1/4 in iodoform gauze  Patient tolerance: Patient tolerated the procedure well with no immediate complications.    MDM   Final diagnoses:  Abscess of right thigh   Patient tolerated incision and drainage of the right inner thigh abscess well no complicating factors. Patient will return to the ED for recheck of the abscess and packing removal in 2 days.       Vanetta MuldersScott Shanicka Oldenkamp, MD 06/08/15 [phone]

## 2015-06-08 NOTE — ED Notes (Signed)
I&D tray, local at bedside per EDP request

## 2015-06-08 NOTE — ED Notes (Signed)
States had a Boil approx 1 month ago, has another on inner rt thigh, noted to begin approx 3 days ago

## 2015-06-08 NOTE — ED Notes (Signed)
MD at bedside. 

## 2015-06-08 NOTE — ED Notes (Signed)
Area on rt inner thigh, round in shape, appears to have clear drainage.

## 2015-06-08 NOTE — ED Notes (Signed)
Pt has tried warm compresses also

## 2015-06-10 ENCOUNTER — Emergency Department (HOSPITAL_BASED_OUTPATIENT_CLINIC_OR_DEPARTMENT_OTHER)
Admission: EM | Admit: 2015-06-10 | Discharge: 2015-06-10 | Disposition: A | Discharge status: Home / Self Care | Payer: Self-pay | Attending: Emergency Medicine | Admitting: Emergency Medicine

## 2015-06-10 ENCOUNTER — Encounter (HOSPITAL_BASED_OUTPATIENT_CLINIC_OR_DEPARTMENT_OTHER): Payer: Self-pay | Admitting: *Deleted

## 2015-06-10 DIAGNOSIS — Z4801 Encounter for change or removal of surgical wound dressing: Secondary | ICD-10-CM | POA: Insufficient documentation

## 2015-06-10 DIAGNOSIS — Z792 Long term (current) use of antibiotics: Secondary | ICD-10-CM | POA: Insufficient documentation

## 2015-06-10 DIAGNOSIS — Z5189 Encounter for other specified aftercare: Secondary | ICD-10-CM

## 2015-06-10 DIAGNOSIS — Z87891 Personal history of nicotine dependence: Secondary | ICD-10-CM | POA: Insufficient documentation

## 2015-06-10 NOTE — ED Provider Notes (Signed)
CSN: [id number]     Arrival date & time 06/10/15  1315 History   First MD Initiated Contact with Patient 06/10/15 1341     Chief Complaint  Patient presents with  . Wound Check     (Consider location/radiation/quality/duration/timing/severity/associated sxs/prior Treatment) HPI   Johnny Lawrence is a 39 y.o. male presenting for a wound recheck. Pt had an abscess I&D performed on 10/28 here in the ED on his right inner thigh.  Pt denies any redness, swelling, streaking, increased pain, fever/chills, or any other complaints.   History reviewed. No pertinent past medical history. Past Surgical History  Procedure Laterality Date  . Hernia repair    . Testicle removal Right    No family history on file. Social History  Substance Use Topics  . Smoking status: Former Games developermoker  . Smokeless tobacco: Never Used  . Alcohol Use: Yes     Comment: occasional    Review of Systems  Skin:       Wound recheck on right inner thigh      Allergies  Review of patient's allergies indicates no known allergies.  Home Medications   Prior to Admission medications   Medication Sig Start Date End Date Taking? Authorizing Provider  amoxicillin (AMOXIL) 500 MG capsule Take 1 capsule (500 mg total) by mouth 3 (three) times daily. 11/07/12   Zadie Rhineonald Wickline, MD  ciprofloxacin (CIPRO) 500 MG tablet Take 1 tablet (500 mg total) by mouth 2 (two) times daily. One po bid x 7 days 08/22/14   Paula LibraJohn Molpus, MD  guaiFENesin-codeine 100-10 MG/5ML syrup Take 10 mLs by mouth every 6 (six) hours as needed for cough. 08/12/13   Geoffery Lyonsouglas Delo, MD  HYDROcodone-acetaminophen (NORCO) 5-325 MG per tablet Take 1-2 tablets by mouth every 6 (six) hours as needed (for pain). 08/22/14   John Molpus, MD  ibuprofen (ADVIL,MOTRIN) 200 MG tablet Take 200 mg by mouth as needed.    Historical Provider, MD   BP 140/93 mmHg  Pulse 71  Temp(Src) 98.1 F (36.7 C) (Oral)  Resp 18  Ht 6\' 3"  (1.905 m)  Wt 285 lb (129.275 kg)  BMI 35.62  kg/m2  SpO2 100% Physical Exam  Constitutional: He appears well-developed and well-nourished.  Skin: Skin is warm and dry. No erythema.  Area of induration on right upper, inner thigh. I&D site visible with packing still in place. No tenderness, fluctuance, or erythema. Wound seems to be healing well.     ED Course  Procedures (including critical care time) Labs Review Labs Reviewed - No data to display  Imaging Review No results found. I have personally reviewed and evaluated these images and lab results as part of my medical decision-making.   EKG Interpretation None      MDM   Final diagnoses:  Encounter for wound re-check    Johnny Lawrence presents for a wound recheck following an I&D.  Pt wound seems to be healing well with no signs of current abscess or cellulitis. Pt asked about ABX, but it was explained that ABX weren't indicated in most of abscess cases and why that is. Pt to watch the wound for signs of infection. Detailed signs of infection and aftercare instructions given.       Anselm PancoastShawn C Joy, PA-C 06/10/15 1403  Gilda Creasehristopher J Pollina, MD 06/10/15 1534

## 2015-06-10 NOTE — Discharge Instructions (Signed)
You have been seen today for a wound recheck. Your wound seems to be healing normally. Follow up with PCP as needed. Return to ED should symptoms worsen.   Emergency Department Resource Guide 1) Find a Doctor and Pay Out of Pocket Although you won't have to find out who is covered by your insurance plan, it is a good idea to ask around and get recommendations. You will then need to call the office and see if the doctor you have chosen will accept you as a new patient and what types of options they offer for patients who are self-pay. Some doctors offer discounts or will set up payment plans for their patients who do not have insurance, but you will need to ask so you aren't surprised when you get to your appointment.  2) Contact Your Local Health Department Not all health departments have doctors that can see patients for sick visits, but many do, so it is worth a call to see if yours does. If you don't know where your local health department is, you can check in your phone book. The CDC also has a tool to help you locate your state's health department, and many state websites also have listings of all of their local health departments.  3) Find a Walk-in Clinic If your illness is not likely to be very severe or complicated, you may want to try a walk in clinic. These are popping up all over the country in pharmacies, drugstores, and shopping centers. They're usually staffed by nurse practitioners or physician assistants that have been trained to treat common illnesses and complaints. They're usually fairly quick and inexpensive. However, if you have serious medical issues or chronic medical problems, these are probably not your best option.  No Primary Care Doctor: - Call Health Connect at  [phone] - they can help you locate a primary care doctor that  accepts your insurance, provides certain services, etc. - Physician Referral Service- [phone]  Chronic Pain Problems: Organization          Address  Phone   Notes  [facility]  [phone] Patients need to be referred by their primary care doctor.   Medication Assistance: Organization         Address  Phone   Notes  [facility] [address] [place], [place] [address] [phone] --Must be a resident of [facility] -- Must have NO insurance coverage whatsoever (no Medicaid/ Medicare, etc.) -- The pt. MUST have a primary care doctor that directs their care regularly and follows them in the community   MedAssist  [phone]   [employer]  [phone]    Agencies that provide inexpensive medical care: Organization         Address  Phone   Notes  [facility]  [phone]   [facility]    [phone]   [facility] [address] [place], [place] [address] [phone]   [facility] [address], [place] [phone]   Planned Parenthood    [phone]   [facility]    [phone]   [facility] and [facility]  [address], [place] Phone:  [phone], Fax:  [phone] Hours of Operation:  9 am - 6 pm, M-F.  Also accepts Medicaid/Medicare and self-pay.  [facility]  [address]  [address], [place] Phone: [phone], Fax: [phone]. Hours of Operation:  8:30 am - 5:30 pm, M-F.  Also accepts Medicaid and self-pay.  [facility] [address] [place] Phone: [phone]   [place], [place], [place] [phone], Ext. [phone] Mondays & Thursdays: 7-9 AM.  First 15 patients are seen on a first come, first serve basis.    [place] Providers:  Organization         Address  Phone   Notes  [facility] [address], [place] [phone] Also accepts self-pay patients.  [facility] [address], [place]  [phone]   [place], [address], [place] [phone]   [facility] [address], [place] [phone]   Lucianne Lei [address], [place]   [phone] Only accepts [place] Access [place] patients after they have their name applied to their card.   Self-Pay (no insurance) in [facility]:  Organization         Address  Phone   Notes  Sickle Cell Patients, [facility] [place] [phone]   [facility] [place] [phone]   [facility] [place]  [place], [place], [place] [phone]   [facility]/Dr. Osei-Bonsu  [address] [place] or [address], [place] [phone] Phone number for both [place] and [place] locations is the same.  Urgent Medical and [facility] [address] [place] [phone]   [facility] [address] [place] or [address] [phone]   [facility] [address], [place] [phone], phone; [phone], fax Sees patients 1st and 3rd Saturday of every month.  Must not qualify for public or private insurance (i.e. Medicaid, Medicare, [place] Health Choice, Veterans' Benefits)  Household income should be no more than 200% of the poverty level The clinic cannot treat you if you are pregnant or think you are pregnant  Sexually transmitted diseases are not treated at the clinic.    Dental Care: Organization         Address  Phone  Notes  [facility] Department of [facility] [phone] Accepts children up to age 74 who are enrolled in [place] or [place]; pregnant women with a Medicaid card; and  children who have applied for Medicaid or [place] Health Choice, but were declined, whose parents can pay a reduced fee at time of service.  [facility] Department of [facility]  [address], [place] [phone] Accepts children up to age 79 who are enrolled in [place] or [place]; pregnant women with a Medicaid card; and children who have applied for Medicaid or [place] Health Choice, but were declined, whose parents can pay a reduced fee at time of service.  [facility] PRO[facility]  [place] [phone] Patients are seen by appointment only. Walk-ins are not accepted. [place] will see patients 64 years of age and older. Monday - Tuesday (8am-5pm) Most Wednesdays (8:30-5pm) $30 per visit, cash only  [place]  [address], [place] [phone] Patients are seen by appointment only. Walk-ins are not accepted. [place] will see patients 31 years of age and older. One Wednesday Evening (Monthly: Volunteer Based).  $30 per visit, cash only  [place]  [phone] for adults; Children under age 53, call Graduate Pediatric Dentistry at [phone]. Children aged 35-14, please call [phone] to request a pediatric application.  Dental services are provided in all areas of dental care including fillings, crowns and bridges, complete and partial dentures, implants, gum treatment, root canals, and extractions. Preventive care is also provided. Treatment is provided to both adults and children. Patients are selected via a lottery and there is often a waiting list.   [facility] [address] [place]  [phone] [email or web address]   [facility] [address] [place], [place] [phone], Ext. [phone] Second and Fourth Thursday of each month, opens at 6:30 AM; Clinic ends at 9 AM.  Patients are seen on a first-come first-served  basis, and a limited number are seen during each clinic.   [facility]  [address] [place] Danker [place], [place] [phone]   Eligibility Requirements You must have lived in [place], [place], or [place] counties for at least the last three months.   You cannot be eligible for state or federal sponsored [employer], including [employer], [place], or [employer].   You generally cannot be eligible for healthcare insurance through your employer.    How to apply: Eligibility screenings are held every Tuesday and Wednesday afternoon from 1:00 pm until 4:00 pm. You do not need an appointment for the interview!  [facility] [address], [place], [place]   [place]  [place] Department  [place]  [phone]    Behavioral Health Resources in the Community: Intensive Outpatient Programs Organization         Address  Phone  Notes  [place] [address], [place], [place] [phone]   [facility] Outpatient [address] [place], [place]   ADS: Alcohol & Drug Svcs [address], [place], [place]   [place] [address],  [place], [place] or [phone]   Substance Abuse Resources Organization         Address  Phone  Notes  Alcohol and Drug Services  [phone]   [place]  [phone]   The [place]   [place]  [phone]   Residential & Outpatient Substance Abuse Program  [phone]   Psychological Services Organization         Address  Phone  Notes  [facility] [place]  [place]  [phone]   [place] [address], [place] or [phone]    Mobile Crisis Teams Organization          Address  Phone  Notes  [facility], Mobile Crisis Care Unit  [phone]   [facility]otherapeutic [facility]  [address] [place], [place]   [place] Levels [address], [place] [phone]    Self-Help/Support Groups Organization         Address  Phone             Notes  [place] of [place] -  variety of support groups  [phone] Call for more information  Narcotics Anonymous (NA), [facility]aring [facility] [address], [place]  2 meetings at this location   Residential Facilities manager         Address  Phone  Notes  [facility] [place],    [place]  [phone]   [facility]  [address] [place] [phone], [place], [place]   [place], [place] [phone] Admissions: 8am-3pm M-F  Incentives Substance [place] [address]    [place], [place] [phone]   [facility] [address] [place], [place], [place]   [facility] [address]  [place], [place]   [facility]ight Programs - Intensive Outpatient [address], [place], [place]   [facility] [place].,  [place], [place] [phone] or [phone]   [facility] Treatment Services (RTS) [address], [place], [place] Accepts Medicaid  [facility] [place] [address],  [place] [phone] Substance Abuse/Addiction Treatment   [facility] Organization         Address  Phone  Notes  [facility]  [phone]   Domenic Schwab, PhD [address] [place]lis [place], [place]   [phone] or [phone]   [place], [place] [phone]   [facility] [address] [place], [place] [phone] Insurance/Medicaid/sponsorship  through [facility] and Families [address] [place]                                    [place], [place] [phone] [place] [address], [place] [phone]    Dr. Adele Schilder  [phone]   [facility] Dept. 1) [address]  2) [place] 3)  [place] [address], [place] [phone]  [phone]   [place] [phone] or [phone] (After Hours)

## 2015-06-10 NOTE — ED Notes (Signed)
I applied 4x4's over 2x2's and secured with Medipore tape. I gave patient additional supplies for home.

## 2015-06-10 NOTE — ED Notes (Signed)
I&D right thigh on Friday- here for f/u

## 2015-08-15 ENCOUNTER — Emergency Department (HOSPITAL_BASED_OUTPATIENT_CLINIC_OR_DEPARTMENT_OTHER)
Admission: EM | Admit: 2015-08-15 | Discharge: 2015-08-15 | Disposition: A | Discharge status: Home / Self Care | Payer: Self-pay | Attending: Emergency Medicine | Admitting: Emergency Medicine

## 2015-08-15 ENCOUNTER — Encounter (HOSPITAL_BASED_OUTPATIENT_CLINIC_OR_DEPARTMENT_OTHER): Payer: Self-pay | Admitting: Emergency Medicine

## 2015-08-15 ENCOUNTER — Emergency Department (HOSPITAL_BASED_OUTPATIENT_CLINIC_OR_DEPARTMENT_OTHER): Payer: Self-pay

## 2015-08-15 DIAGNOSIS — Z87891 Personal history of nicotine dependence: Secondary | ICD-10-CM | POA: Insufficient documentation

## 2015-08-15 DIAGNOSIS — Z792 Long term (current) use of antibiotics: Secondary | ICD-10-CM | POA: Insufficient documentation

## 2015-08-15 DIAGNOSIS — Y998 Other external cause status: Secondary | ICD-10-CM | POA: Insufficient documentation

## 2015-08-15 DIAGNOSIS — S6991XA Unspecified injury of right wrist, hand and finger(s), initial encounter: Secondary | ICD-10-CM | POA: Insufficient documentation

## 2015-08-15 DIAGNOSIS — W228XXA Striking against or struck by other objects, initial encounter: Secondary | ICD-10-CM | POA: Insufficient documentation

## 2015-08-15 DIAGNOSIS — Y9289 Other specified places as the place of occurrence of the external cause: Secondary | ICD-10-CM | POA: Insufficient documentation

## 2015-08-15 DIAGNOSIS — Y9389 Activity, other specified: Secondary | ICD-10-CM | POA: Insufficient documentation

## 2015-08-15 MED ORDER — IBUPROFEN 400 MG PO TABS: 600.0000 mg | ORAL_TABLET | Freq: Once | ORAL | Status: DC

## 2015-08-15 MED ORDER — IBUPROFEN 800 MG PO TABS
800.0000 mg | ORAL_TABLET | Freq: Once | ORAL | Status: AC
  Administered 2015-08-15: 800 mg via ORAL
  Filled 2015-08-15: qty 1

## 2015-08-15 NOTE — ED Provider Notes (Signed)
CSN: [id number]     Arrival date & time 08/15/15  [id number] History   First MD Initiated Contact with Patient 08/15/15 [phone]     Chief Complaint  Patient presents with  . Hand Injury     (Consider location/radiation/quality/duration/timing/severity/associated sxs/prior Treatment) HPI  40 year old male who presents with left hand injury. Left hand dominant and works in [employer]. Slammed right hand into his car door, and injured his right middle finger.  Went to work yesterday evening as did not want to miss work. No other injuries. Noticed swelling to tip of right middle finger with decreased sensation at this tip.  History reviewed. No pertinent past medical history. Past Surgical History  Procedure Laterality Date  . Hernia repair    . Testicle removal Right    History reviewed. No pertinent family history. Social History  Substance Use Topics  . Smoking status: Former Games developermoker  . Smokeless tobacco: Never Used  . Alcohol Use: Yes     Comment: occasional    Review of Systems  Constitutional: Negative for fever.  Musculoskeletal: Positive for joint swelling (right DIP joint of middle finger).  Skin: Negative for wound.  Allergic/Immunologic: Negative for immunocompromised state.  Hematological: Does not bruise/bleed easily.  All other systems reviewed and are negative.      Allergies  Review of patient's allergies indicates no known allergies.  Home Medications   Prior to Admission medications   Medication Sig Start Date End Date Taking? Authorizing Provider  amoxicillin (AMOXIL) 500 MG capsule Take 1 capsule (500 mg total) by mouth 3 (three) times daily. 11/07/12  Yes Zadie Rhineonald Wickline, MD  ciprofloxacin (CIPRO) 500 MG tablet Take 1 tablet (500 mg total) by mouth 2 (two) times daily. One po bid x 7 days 08/22/14  Yes John Molpus, MD  guaiFENesin-codeine 100-10 MG/5ML syrup Take 10 mLs by mouth every 6 (six) hours as needed for cough. 08/12/13  Yes Geoffery Lyonsouglas Delo, MD   HYDROcodone-acetaminophen (NORCO) 5-325 MG per tablet Take 1-2 tablets by mouth every 6 (six) hours as needed (for pain). 08/22/14  Yes John Molpus, MD  ibuprofen (ADVIL,MOTRIN) 200 MG tablet Take 200 mg by mouth as needed.    Historical Provider, MD   BP 131/105 mmHg  Pulse 73  Temp(Src) 97.8 F (36.6 C) (Oral)  Resp 17  Ht 6\' 3"  (1.905 m)  Wt 256 lb (116.121 kg)  BMI 32.00 kg/m2  SpO2 98% Physical Exam Physical Exam  Nursing note and vitals reviewed. Constitutional: Well developed, well nourished, non-toxic, and in no acute distress Head: Normocephalic and atraumatic.  Mouth/Throat: Mucous membranes appear dry..  Neck: Normal range of motion. Neck supple.  Cardiovascular: +2 radial pulses bilaterally   Pulmonary/Chest: Effort normal. No conversational dyspnea Abdominal: Soft. Non-tender Musculoskeletal: focused exam of the right middle finger reveals mild soft tissue swelling distal to DIP. Full range of motion of the PIP and MCP joint. DIP held in extension with limited ability to flex at the DIP joint. Neurological: Alert, no facial droop, fluent speech, full motor and sensation involving the innervation of radial, ulnar and median nerves of the right hand Skin: Skin is warm and dry.  Psychiatric: Cooperative  ED Course  Procedures (including critical care time) Labs Review Labs Reviewed - No data to display  Imaging Review Dg Hand Complete Right  08/15/2015  CLINICAL DATA:  The patient closed his right long finger in a car door approximately 14 hours ago. Pain and swelling. Initial encounter. EXAM: RIGHT HAND -  COMPLETE 3+ VIEW COMPARISON:  None. FINDINGS: There soft tissue swelling about the distal aspect of the right long finger. No fracture, dislocation or foreign body is identified. IMPRESSION: Soft tissue swelling without underlying bony or joint abnormality. Electronically Signed   By: Drusilla Kanner M.D.   On: 08/15/2015 07:19   I have personally reviewed and  evaluated these images and lab results as part of my medical decision-making.   EKG Interpretation None      MDM   Final diagnoses:  Injury of middle finger, right, initial encounter    40 year old left hand dominant male who presents with right middle finger injury. Well appearing with stable VS. Swelling at the distal tip of the right middle finger with inability to flex at the DIP. XR without fracture. May be just soft tissue injury with flexion of DIP limited by swelling although cannot rule out FDP injury. Will splint in extension and given ortho hand follow-up for re-exam in one week. Strict return and follow-up instructions reviewed. He expressed understanding of all discharge instructions and felt comfortable with the plan of care.     [place]a Guise, MD 08/15/15 [phone]

## 2015-08-15 NOTE — Discharge Instructions (Signed)
Your x-ray does not show any broken bones of the finger.  Take motrin and tylenol for pain control. You can ice finger to also keep swelling down.  Follow-up with hand surgery in 1 week for re-evaluation.  Return for worsening symptoms, including worsening pain, fever, redness or pus drainage or any other symptoms concerning to you.

## 2015-08-15 NOTE — ED Notes (Signed)
Pt states he closed the car door on his right middle finger yesterday at 1730. Pain level 5/10, edema noted to finger, skin intact.

## 2015-12-25 ENCOUNTER — Encounter (HOSPITAL_BASED_OUTPATIENT_CLINIC_OR_DEPARTMENT_OTHER): Payer: Self-pay

## 2015-12-25 ENCOUNTER — Emergency Department (HOSPITAL_BASED_OUTPATIENT_CLINIC_OR_DEPARTMENT_OTHER)
Admission: EM | Admit: 2015-12-25 | Discharge: 2015-12-25 | Disposition: A | Discharge status: Home / Self Care | Payer: BLUE CROSS/BLUE SHIELD | Attending: Emergency Medicine | Admitting: Emergency Medicine

## 2015-12-25 DIAGNOSIS — M79675 Pain in left toe(s): Secondary | ICD-10-CM | POA: Diagnosis present

## 2015-12-25 DIAGNOSIS — Z87891 Personal history of nicotine dependence: Secondary | ICD-10-CM | POA: Diagnosis not present

## 2015-12-25 DIAGNOSIS — B353 Tinea pedis: Secondary | ICD-10-CM | POA: Diagnosis not present

## 2015-12-25 LAB — CBG MONITORING, ED: Glucose-Capillary: 110 mg/dL — ABNORMAL HIGH (ref 65–99)

## 2015-12-25 MED ORDER — CLOTRIMAZOLE 1 % EX CREA: TOPICAL_CREAM | CUTANEOUS | Status: DC

## 2015-12-25 MED ORDER — FLUCONAZOLE 200 MG PO TABS: 200.0000 mg | ORAL_TABLET | Freq: Every day | ORAL | Status: AC

## 2015-12-25 MED FILL — FLUCONAZOLE 200 MG TABLET: 200 | 3 days supply | Qty: 3 | Fill #0

## 2015-12-25 MED FILL — CLOTRIMAZOLE 1% CREAM: 1 | 28 days supply | Qty: 28 | Fill #0

## 2015-12-25 NOTE — Discharge Instructions (Signed)

## 2015-12-25 NOTE — ED Provider Notes (Addendum)
CSN: [id number]     Arrival date & time 12/25/15  1457 History   First MD Initiated Contact with Patient 12/25/15 1507     Chief Complaint  Patient presents with  . Toe Injury     (Consider location/radiation/quality/duration/timing/severity/associated sxs/prior Treatment) HPI Comments: Patient started to have left plantar toe pain 2 days ago which is only worsened. He cannot recall an injury but states the area burns. His mom looked the bottom of his foot and said he had a cut please do not know how it got there. He denies any bleeding but states there is a constant burning. It is painful to walk.  He denies any fever or redness.  Also his biological father had a history of diabetes in his mom was concerned that he might be diabetic. Patient has not had a blood sugar check in a long time. He does not get routine care. He does admit to being thirsty a lot and having mild polyuria but no recent weight loss, vomiting, abdominal pain.  The history is provided by the patient.    History reviewed. No pertinent past medical history. Past Surgical History  Procedure Laterality Date  . Hernia repair    . Testicle removal Right    No family history on file. Social History  Substance Use Topics  . Smoking status: Former Games developermoker  . Smokeless tobacco: Never Used  . Alcohol Use: Yes     Comment: occasional    Review of Systems  All other systems reviewed and are negative.     Allergies  Review of patient's allergies indicates no known allergies.  Home Medications   Prior to Admission medications   Not on File   BP 120/93 mmHg  Pulse 79  Temp(Src) 98.8 F (37.1 C) (Oral)  Resp 20  Ht 6\' 3"  (1.905 m)  Wt 258 lb (117.028 kg)  BMI 32.25 kg/m2  SpO2 99% Physical Exam  Constitutional: He is oriented to person, place, and time. He appears well-developed and well-nourished. No distress.  HENT:  Head: Normocephalic and atraumatic.  Mouth/Throat: Oropharynx is clear and moist.    Eyes: Conjunctivae and EOM are normal. Pupils are equal, round, and reactive to light.  Neck: Normal range of motion. Neck supple.  Cardiovascular: Normal rate, regular rhythm and intact distal pulses.   No murmur heard. Pulmonary/Chest: Effort normal and breath sounds normal. No respiratory distress. He has no wheezes. He has no rales.  Musculoskeletal: Normal range of motion. He exhibits no edema or tenderness.  Neurological: He is alert and oriented to person, place, and time.  Skin: Skin is warm and dry. Lesion and rash noted. Rash is pustular. No erythema.     Psychiatric: He has a normal mood and affect. His behavior is normal.  Nursing note and vitals reviewed.   ED Course  Procedures (including critical care time) Labs Review Labs Reviewed - No data to display  Imaging Review No results found. I have personally reviewed and evaluated these images and lab results as part of my medical decision-making.   EKG Interpretation None      MDM   Final diagnoses:  Tinea pedis of both feet    Patient is a 40 year old male presenting with a complaint of toe pain with evidence of moderate tinea and cracked skin to the bottom of bilateral feet but worse on the left. No evidence of bacterial infection at this time. Patient will be treated with clotrimazole. Also he is requesting a blood sugar checked  because it runs in his family and he does admit to having mild polydipsia and polyuria.  3:33 PM nonfasting blood sugar 110.  Low suspicion for dm  Gwyneth Sprout, MD 12/25/15 1533  Gwyneth Sprout, MD 12/25/15 1535

## 2015-12-25 NOTE — ED Notes (Signed)
Pt states he noticed " a deep cut" on left toe 2 days ago while in the shower-denies definite injury-NAD-limping gait

## 2016-02-27 ENCOUNTER — Emergency Department (HOSPITAL_BASED_OUTPATIENT_CLINIC_OR_DEPARTMENT_OTHER)
Admission: EM | Admit: 2016-02-27 | Discharge: 2016-02-27 | Disposition: A | Discharge status: Home / Self Care | Payer: BLUE CROSS/BLUE SHIELD | Attending: Emergency Medicine | Admitting: Emergency Medicine

## 2016-02-27 ENCOUNTER — Encounter (HOSPITAL_BASED_OUTPATIENT_CLINIC_OR_DEPARTMENT_OTHER): Payer: Self-pay | Admitting: Emergency Medicine

## 2016-02-27 DIAGNOSIS — M545 Low back pain: Secondary | ICD-10-CM | POA: Diagnosis present

## 2016-02-27 DIAGNOSIS — Y93F2 Activity, caregiving, lifting: Secondary | ICD-10-CM | POA: Diagnosis not present

## 2016-02-27 DIAGNOSIS — Z79899 Other long term (current) drug therapy: Secondary | ICD-10-CM | POA: Diagnosis not present

## 2016-02-27 DIAGNOSIS — Z792 Long term (current) use of antibiotics: Secondary | ICD-10-CM | POA: Diagnosis not present

## 2016-02-27 DIAGNOSIS — Y929 Unspecified place or not applicable: Secondary | ICD-10-CM | POA: Diagnosis not present

## 2016-02-27 DIAGNOSIS — S39012A Strain of muscle, fascia and tendon of lower back, initial encounter: Secondary | ICD-10-CM | POA: Insufficient documentation

## 2016-02-27 DIAGNOSIS — X503XXA Overexertion from repetitive movements, initial encounter: Secondary | ICD-10-CM

## 2016-02-27 DIAGNOSIS — Z87891 Personal history of nicotine dependence: Secondary | ICD-10-CM | POA: Diagnosis not present

## 2016-02-27 DIAGNOSIS — Y999 Unspecified external cause status: Secondary | ICD-10-CM | POA: Diagnosis not present

## 2016-02-27 DIAGNOSIS — X501XXA Overexertion from prolonged static or awkward postures, initial encounter: Secondary | ICD-10-CM | POA: Insufficient documentation

## 2016-02-27 MED ORDER — CYCLOBENZAPRINE HCL 10 MG PO TABS: 10.0000 mg | ORAL_TABLET | Freq: Two times a day (BID) | ORAL | Status: DC | PRN

## 2016-02-27 MED ORDER — NAPROXEN 500 MG PO TABS
500.0000 mg | ORAL_TABLET | Freq: Two times a day (BID) | ORAL | Status: DC
Start: 2016-02-27 — End: 2016-11-04

## 2016-02-27 MED FILL — CYCLOBENZAPRINE 10 MG TAB: 10 | 10 days supply | Qty: 20 | Fill #0

## 2016-02-27 MED FILL — NAPROXEN 500 MG TABLET: 500 | 15 days supply | Qty: 30 | Fill #0

## 2016-02-27 NOTE — ED Notes (Signed)
Pt teaching provided on medications that may cause drowsiness. Pt instructed not to drive or operate heavy machinery while taking the prescribed medication. Pt verbalized understanding.   

## 2016-02-27 NOTE — ED Notes (Signed)
MD at bedside. 

## 2016-02-27 NOTE — ED Provider Notes (Signed)
CSN: [id number]     Arrival date & time 02/27/16  1358 History   First MD Initiated Contact with Patient 02/27/16 1409     Chief Complaint  Patient presents with  . Back Pain     (Consider location/radiation/quality/duration/timing/severity/associated sxs/prior Treatment) HPI   40 year old male presenting with complaints of left low back pain. Patient reports the past 3 days he has increasing pain and tightness along his left side of back and worsening with twisting or with heavy lifting. Pain is moderate in intensity, improves when he lays still and minimally improved with taking ibuprofen for the past 2 days. He works at a [employer] and admits to doing a lot of heavy lifting and twisting at work. States for the past several days he has had to lift at least 300-400 mattress this and twisting his back each time. Denies any specific injury and denies experienced any pops or cracks. No complaints of fever, chills, lightheadedness, dizziness, chest pain, shortness of breath, dysuria, hematuria, focal numbness or weakness. Denies any rash. Denies nausea vomiting or diarrhea. No hx of IVDU or active cancer.   History reviewed. No pertinent past medical history. Past Surgical History  Procedure Laterality Date  . Hernia repair    . Testicle removal Right    No family history on file. Social History  Substance Use Topics  . Smoking status: Former Games developer  . Smokeless tobacco: Never Used  . Alcohol Use: Yes     Comment: occasional    Review of Systems  All other systems reviewed and are negative.     Allergies  Review of patient's allergies indicates no known allergies.  Home Medications   Prior to Admission medications   Medication Sig Start Date End Date Taking? Authorizing Provider  amoxicillin (AMOXIL) 500 MG capsule Take 500 mg by mouth 3 (three) times daily.   Yes Historical Provider, MD  Vitamin D, Ergocalciferol, (DRISDOL) 50000 units CAPS capsule Take 50,000 Units  by mouth every 7 (seven) days.   Yes Historical Provider, MD  clotrimazole (LOTRIMIN) 1 % cream Apply to affected area 2 times daily 12/25/15   Gwyneth Sprout, MD   BP 130/85 mmHg  Pulse 71  Temp(Src) 98.7 F (37.1 C) (Oral)  Resp 18  Ht  (1.905 m)  Wt 112.492 kg  BMI 31.00 kg/m2  SpO2 96% Physical Exam  Constitutional: He appears well-developed and well-nourished. No distress.  HENT:  Head: Atraumatic.  Eyes: Conjunctivae are normal.  Neck: Neck supple.  Abdominal: Soft. There is no tenderness.  Musculoskeletal: He exhibits tenderness (enderness to lumbar and left paralumbar spinal muscle on palpation with increasing pain to lateral rotation and with flexion but no overlying skin changes.).  Neurological: He is alert.  5/5 strength to bilateral lower extremities with intact patellar deep tendon reflex and no foot drops. Able to ambulate with a limp.  Skin: No rash noted.  Psychiatric: He has a normal mood and affect.  Nursing note and vitals reviewed.   ED Course  Procedures (including critical care time)   MDM   Final diagnoses:  Repetitive strain injury of lower back, initial encounter    BP 130/85 mmHg  Pulse 71  Temp(Src) 98.7 F (37.1 C) (Oral)  Resp 18  Ht  (1.905 m)  Wt 112.492 kg  BMI 31.00 kg/m2  SpO2 96%   3:13 PM Patient presents with left low back pain which is reproducible and worsening with movement. Suspect muscle skeletal. Low suspicion for pyelonephritis, kidney  stones, colitis, abdominal pathology, spinal infection such as epidural abscess.  Recommend RICE.  Work note provided.  Ortho referral given as needed.  Return precaution discussed.    Fayrene HelperBowie Harrol Novello, PA-C 02/27/16 1518  Vanetta MuldersScott Zackowski, MD 02/28/16 1550

## 2016-02-27 NOTE — ED Notes (Signed)
Pt c/o left side lower back pain, sharp in nature and persistent since Sunday. 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 Ambulatory with a limp. A/O NAD

## 2016-02-27 NOTE — Discharge Instructions (Signed)

## 2016-10-31 ENCOUNTER — Encounter (HOSPITAL_BASED_OUTPATIENT_CLINIC_OR_DEPARTMENT_OTHER): Payer: Self-pay | Admitting: Emergency Medicine

## 2016-10-31 ENCOUNTER — Emergency Department (HOSPITAL_BASED_OUTPATIENT_CLINIC_OR_DEPARTMENT_OTHER)
Admission: EM | Admit: 2016-10-31 | Discharge: 2016-10-31 | Disposition: A | Discharge status: Home / Self Care | Payer: BLUE CROSS/BLUE SHIELD | Attending: Emergency Medicine | Admitting: Emergency Medicine

## 2016-10-31 DIAGNOSIS — H9201 Otalgia, right ear: Secondary | ICD-10-CM | POA: Diagnosis present

## 2016-10-31 DIAGNOSIS — Z87891 Personal history of nicotine dependence: Secondary | ICD-10-CM | POA: Diagnosis not present

## 2016-10-31 DIAGNOSIS — H6501 Acute serous otitis media, right ear: Secondary | ICD-10-CM | POA: Diagnosis not present

## 2016-10-31 MED ORDER — AMOXICILLIN 500 MG PO CAPS: 500.0000 mg | ORAL_CAPSULE | Freq: Three times a day (TID) | ORAL | 0 refills | Status: DC

## 2016-10-31 NOTE — ED Provider Notes (Signed)
[facility]   CSN: [id number] Arrival date & time: 10/31/16  0056     History   Chief Complaint Chief Complaint  Patient presents with  . Hearing Problem    HPI Johnny Lawrence is a 41 y.o. male.  HPI Patient reports of respiratory tract last week and now increasing hearing changes for the past 3 days with some fullness and discomfort in his right ear.  No fevers or chills.  No cough or congestion.  Much of his URI symptoms have resolved.  No injury or trauma to his ears.  Symptoms are mild in severity.   History reviewed. No pertinent past medical history.  There are no active problems to display for this patient.   Past Surgical History:  Procedure Laterality Date  . HERNIA REPAIR    . TESTICLE REMOVAL Right        Home Medications    Prior to Admission medications   Medication Sig Start Date End Date Taking? Authorizing Provider  amoxicillin (AMOXIL) 500 MG capsule Take 1 capsule (500 mg total) by mouth 3 (three) times daily. 10/31/16   Azalia BilisKevin Labrian Torregrossa, MD  clotrimazole (LOTRIMIN) 1 % cream Apply to affected area 2 times daily 12/25/15   Gwyneth SproutWhitney Plunkett, MD  cyclobenzaprine (FLEXERIL) 10 MG tablet Take 1 tablet (10 mg total) by mouth 2 (two) times daily as needed for muscle spasms. 02/27/16   Fayrene HelperBowie Tran, PA-C  naproxen (NAPROSYN) 500 MG tablet Take 1 tablet (500 mg total) by mouth 2 (two) times daily. 02/27/16   Fayrene HelperBowie Tran, PA-C  Vitamin D, Ergocalciferol, (DRISDOL) 50000 units CAPS capsule Take 50,000 Units by mouth every 7 (seven) days.    Historical Provider, MD    Family History History reviewed. No pertinent family history.  Social History Social History  Substance Use Topics  . Smoking status: Former Games developermoker  . Smokeless tobacco: Never Used  . Alcohol use Yes     Comment: occasional     Allergies   Patient has no known allergies.   Review of Systems Review of Systems  All other systems reviewed and are  negative.    Physical Exam Updated Vital Signs BP 140/88 (BP Location: Left Arm)   Pulse 60   Temp 98.4 F (36.9 C) (Oral)   Resp 18   Ht 6\' 3"  (1.905 m)   Wt 248 lb (112.5 kg)   SpO2 100%   BMI 31.00 kg/m   Physical Exam  Constitutional: He is oriented to person, place, and time. He appears well-developed and well-nourished.  HENT:  Head: Normocephalic and atraumatic.  Left TM is normal.  Right TM is bulging with mild erythema.  No obvious abnormalities of the external auditory canal on the right.  No mastoid tenderness.  Eyes: EOM are normal.  Neck: Normal range of motion.  Pulmonary/Chest: Effort normal.  Abdominal: He exhibits no distension.  Musculoskeletal: Normal range of motion.  Neurological: He is alert and oriented to person, place, and time.  Psychiatric: He has a normal mood and affect.  Nursing note and vitals reviewed.    ED Treatments / Results  Labs (all labs ordered are listed, but only abnormal results are displayed) Labs Reviewed - No data to display  EKG  EKG Interpretation None       Radiology No results found.  Procedures Procedures (including critical care time)  Medications Ordered in ED Medications - No data to display   Initial Impression / Assessment and Plan / ED Course  I have reviewed the triage vital signs and the nursing notes.  Pertinent labs & imaging results that were available during my care of the patient were reviewed by me and considered in my medical decision making (see chart for details).     he'll be treated with amoxicillin.  He will need to follow-up with ENT of his symptoms persist for greater than a week.  He'll need his hearing checked after this.  Final Clinical Impressions(s) / ED Diagnoses   Final diagnoses:  Right acute serous otitis media, recurrence not specified    New Prescriptions Current Discharge Medication List       Azalia Bilis, MD 10/31/16 [phone]

## 2016-10-31 NOTE — ED Triage Notes (Signed)
Patient states that he has had decrease in hearing ability x 3 days. - today he can hear nothing out of his right ear

## 2016-10-31 NOTE — ED Notes (Signed)
Pt verbalizes understanding of d/c instructions and denies any further needs at this time. 

## 2016-11-03 ENCOUNTER — Encounter (HOSPITAL_BASED_OUTPATIENT_CLINIC_OR_DEPARTMENT_OTHER): Payer: Self-pay

## 2016-11-03 ENCOUNTER — Emergency Department (HOSPITAL_COMMUNITY)
Admission: EM | Admit: 2016-11-03 | Discharge: 2016-11-03 | Disposition: A | Discharge status: Home / Self Care | Payer: BLUE CROSS/BLUE SHIELD | Source: Home / Self Care | Attending: Emergency Medicine | Admitting: Emergency Medicine

## 2016-11-03 ENCOUNTER — Emergency Department (HOSPITAL_BASED_OUTPATIENT_CLINIC_OR_DEPARTMENT_OTHER)
Admission: EM | Admit: 2016-11-03 | Discharge: 2016-11-04 | Disposition: A | Discharge status: Home / Self Care | Payer: BLUE CROSS/BLUE SHIELD | Attending: Emergency Medicine | Admitting: Emergency Medicine

## 2016-11-03 ENCOUNTER — Encounter (HOSPITAL_COMMUNITY): Payer: Self-pay

## 2016-11-03 DIAGNOSIS — H52531 Spasm of accommodation, right eye: Secondary | ICD-10-CM | POA: Diagnosis not present

## 2016-11-03 DIAGNOSIS — S0591XA Unspecified injury of right eye and orbit, initial encounter: Secondary | ICD-10-CM | POA: Insufficient documentation

## 2016-11-03 DIAGNOSIS — H5711 Ocular pain, right eye: Secondary | ICD-10-CM

## 2016-11-03 DIAGNOSIS — Y929 Unspecified place or not applicable: Secondary | ICD-10-CM | POA: Diagnosis not present

## 2016-11-03 DIAGNOSIS — W500XXA Accidental hit or strike by another person, initial encounter: Secondary | ICD-10-CM | POA: Insufficient documentation

## 2016-11-03 DIAGNOSIS — Z87891 Personal history of nicotine dependence: Secondary | ICD-10-CM | POA: Insufficient documentation

## 2016-11-03 DIAGNOSIS — Y999 Unspecified external cause status: Secondary | ICD-10-CM | POA: Insufficient documentation

## 2016-11-03 DIAGNOSIS — Y9389 Activity, other specified: Secondary | ICD-10-CM | POA: Insufficient documentation

## 2016-11-03 DIAGNOSIS — Z5321 Procedure and treatment not carried out due to patient leaving prior to being seen by health care provider: Secondary | ICD-10-CM

## 2016-11-03 MED ORDER — CYCLOPENTOLATE HCL 1 % OP SOLN
2.0000 [drp] | Freq: Once | OPHTHALMIC | Status: AC
  Administered 2016-11-04: 2 [drp] via OPHTHALMIC
  Filled 2016-11-03: qty 2

## 2016-11-03 MED ORDER — FLUORESCEIN SODIUM 0.6 MG OP STRP: 1.0000 | ORAL_STRIP | Freq: Once | OPHTHALMIC | Status: DC

## 2016-11-03 MED ORDER — TETRACAINE HCL 0.5 % OP SOLN: 2.0000 [drp] | Freq: Once | OPHTHALMIC | Status: DC
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
## 2016-11-03 NOTE — ED Triage Notes (Signed)
Pt slapped BP cuff and ID stickers on NF dress and walked out the door. Pt did not speak to RN. Pt was assigned to room but was not called back yet. Will d/c
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
## 2016-11-04 MED ORDER — HOMATROPINE HBR 5 % OP SOLN
2.0000 [drp] | Freq: Three times a day (TID) | OPHTHALMIC | 0 refills | Status: AC
Start: 2016-11-04 — End: ?

## 2016-11-27 ENCOUNTER — Encounter (HOSPITAL_BASED_OUTPATIENT_CLINIC_OR_DEPARTMENT_OTHER): Payer: Self-pay | Admitting: *Deleted

## 2016-11-27 ENCOUNTER — Emergency Department (HOSPITAL_BASED_OUTPATIENT_CLINIC_OR_DEPARTMENT_OTHER)
Admission: EM | Admit: 2016-11-27 | Discharge: 2016-11-27 | Disposition: A | Discharge status: Home / Self Care | Payer: BLUE CROSS/BLUE SHIELD | Attending: Emergency Medicine | Admitting: Emergency Medicine

## 2016-11-27 DIAGNOSIS — Z87891 Personal history of nicotine dependence: Secondary | ICD-10-CM | POA: Diagnosis not present

## 2016-11-27 DIAGNOSIS — H6591 Unspecified nonsuppurative otitis media, right ear: Secondary | ICD-10-CM | POA: Diagnosis not present

## 2016-11-27 DIAGNOSIS — H9201 Otalgia, right ear: Secondary | ICD-10-CM | POA: Diagnosis present

## 2016-11-27 MED ORDER — FLUTICASONE PROPIONATE 50 MCG/ACT NA SUSP: 2.0000 | Freq: Every day | NASAL | 2 refills | Status: DC

## 2016-11-27 MED ORDER — CETIRIZINE-PSEUDOEPHEDRINE ER 5-120 MG PO TB12: 1.0000 | ORAL_TABLET | Freq: Every day | ORAL | 0 refills | Status: DC

## 2016-11-27 MED ORDER — AMOXICILLIN 500 MG PO CAPS
500.0000 mg | ORAL_CAPSULE | Freq: Three times a day (TID) | ORAL | 0 refills | Status: DC
Start: 2016-11-27 — End: 2016-11-27

## 2016-11-27 MED ORDER — AMOXICILLIN 500 MG PO CAPS: 500.0000 mg | ORAL_CAPSULE | Freq: Three times a day (TID) | ORAL | 0 refills | Status: DC

## 2016-11-27 MED FILL — AMOXICILLIN 500 MG CAPSULE: 500 | 7 days supply | Qty: 21 | Fill #0

## 2016-11-27 MED FILL — FLUTICASONE PROP 50 MCG SPR: 50 | 30 days supply | Qty: 16 | Fill #0

## 2016-11-27 MED FILL — CETIRIZINE-PSE ER 5-120 MG: 5-120 | 30 days supply | Qty: 30 | Fill #0

## 2016-11-27 NOTE — ED Triage Notes (Signed)
Pain in his right ear. States he was seen for same and had fluid on his ear drum. He was given a Rx but lost it before getting it filled.

## 2016-11-27 NOTE — ED Provider Notes (Signed)
[facility]   CSN: [id number] Arrival date & time: 11/27/16  1053     History   Chief Complaint Chief Complaint  Patient presents with  . Otalgia    HPI Johnny Lawrence is a 41 y.o. male.  The history is provided by the patient.  Otalgia  This is a new problem. Episode onset: 3 weeks. There is pain in the right ear. The problem occurs constantly. The problem has been gradually worsening. There has been no fever. The pain is at a severity of 5/10. The pain is moderate. Associated symptoms include rhinorrhea and cough. Pertinent negatives include no ear discharge. Associated symptoms comments: Sneezing and allergies.  Decreased hearing. His past medical history does not include chronic ear infection or tympanostomy tube.    History reviewed. No pertinent past medical history.  There are no active problems to display for this patient.   Past Surgical History:  Procedure Laterality Date  . HERNIA REPAIR    . TESTICLE REMOVAL Right        Home Medications    Prior to Admission medications   Medication Sig Start Date End Date Taking? Authorizing Provider  amoxicillin (AMOXIL) 500 MG capsule Take 1 capsule (500 mg total) by mouth 3 (three) times daily. 10/31/16   Azalia Bilis, MD  homatropine 5 % ophthalmic solution Place 2 drops into the right eye 3 (three) times daily. 11/04/16   John Molpus, MD  Vitamin D, Ergocalciferol, (DRISDOL) 50000 units CAPS capsule Take 50,000 Units by mouth every 7 (seven) days.    Historical Provider, MD    Family History No family history on file.  Social History Social History  Substance Use Topics  . Smoking status: Former Games developer  . Smokeless tobacco: Never Used  . Alcohol use Yes     Comment: occasional     Allergies   Patient has no known allergies.   Review of Systems Review of Systems  HENT: Positive for ear pain and rhinorrhea. Negative for ear discharge.   Respiratory: Positive for cough.     All other systems reviewed and are negative.    Physical Exam Updated Vital Signs BP (!) 150/100   Pulse 69   Temp 98.1 F (36.7 C) (Oral)   Resp 18   Ht  (1.905 m)   Wt 248 lb (112.5 kg)   SpO2 99%   BMI 31.00 kg/m   Physical Exam  Constitutional: He is oriented to person, place, and time. He appears well-developed and well-nourished. No distress.  HENT:  Right Ear: Tympanic membrane is erythematous and bulging. Tympanic membrane is not perforated. A middle ear effusion is present.  Left Ear: Tympanic membrane is not perforated, not erythematous, not retracted and not bulging. A middle ear effusion is present.  Nose: Mucosal edema and rhinorrhea present.  Eyes: EOM are normal. Pupils are equal, round, and reactive to light.  Cardiovascular: Normal rate.   Pulmonary/Chest: Effort normal.  Neurological: He is alert and oriented to person, place, and time.  Skin: Skin is warm and dry.  Psychiatric: He has a normal mood and affect. His behavior is normal.  Nursing note and vitals reviewed.    ED Treatments / Results  Labs (all labs ordered are listed, but only abnormal results are displayed) Labs Reviewed - No data to display  EKG  EKG Interpretation None       Radiology No results found.  Procedures Procedures (including critical care time)  Medications Ordered in ED Medications -  No data to display   Initial Impression / Assessment and Plan / ED Course  I have reviewed the triage vital signs and the nursing notes.  Pertinent labs & imaging results that were available during my care of the patient were reviewed by me and considered in my medical decision making (see chart for details).     Patient presenting with right ear pain with bilateral ear effusions with the right side with erythema and bulging. Concern for early otitis media on the right. Also patient having allergy type symptoms and effusions may be related to that. Patient was seen several  weeks ago for similar but states he threw the prescription away accidentally and never took any antibiotics. He will be given amoxicillin, Flonase and Zyrtec.  Final Clinical Impressions(s) / ED Diagnoses   Final diagnoses:  Right non-suppurative otitis media    New Prescriptions Current Discharge Medication List    START taking these medications   Details  cetirizine-pseudoephedrine (ZYRTEC-D) 5-120 MG tablet Take 1 tablet by mouth daily. Qty: 30 tablet, Refills: 0    fluticasone (FLONASE) 50 MCG/ACT nasal spray Place 2 sprays into both nostrils daily. Qty: 16 g, Refills: 2         Gwyneth Sprout, MD 11/27/16 1134

## 2017-03-10 ENCOUNTER — Encounter (HOSPITAL_BASED_OUTPATIENT_CLINIC_OR_DEPARTMENT_OTHER): Payer: Self-pay | Admitting: Emergency Medicine

## 2017-03-10 ENCOUNTER — Emergency Department (HOSPITAL_BASED_OUTPATIENT_CLINIC_OR_DEPARTMENT_OTHER)
Admission: EM | Admit: 2017-03-10 | Discharge: 2017-03-10 | Disposition: A | Discharge status: Home / Self Care | Payer: BLUE CROSS/BLUE SHIELD | Attending: Emergency Medicine | Admitting: Emergency Medicine

## 2017-03-10 ENCOUNTER — Emergency Department (HOSPITAL_BASED_OUTPATIENT_CLINIC_OR_DEPARTMENT_OTHER): Payer: BLUE CROSS/BLUE SHIELD

## 2017-03-10 DIAGNOSIS — R1013 Epigastric pain: Secondary | ICD-10-CM | POA: Diagnosis not present

## 2017-03-10 DIAGNOSIS — Z79899 Other long term (current) drug therapy: Secondary | ICD-10-CM | POA: Diagnosis not present

## 2017-03-10 DIAGNOSIS — I10 Essential (primary) hypertension: Secondary | ICD-10-CM | POA: Insufficient documentation

## 2017-03-10 DIAGNOSIS — R079 Chest pain, unspecified: Secondary | ICD-10-CM | POA: Diagnosis present

## 2017-03-10 DIAGNOSIS — Z87891 Personal history of nicotine dependence: Secondary | ICD-10-CM | POA: Insufficient documentation

## 2017-03-10 HISTORY — DX: Essential (primary) hypertension: I10

## 2017-03-10 LAB — CBC
HCT: 40.4 % (ref 39.0–52.0)
HEMOGLOBIN: 13.5 g/dL (ref 13.0–17.0)
MCH: 29.2 pg (ref 26.0–34.0)
MCHC: 33.4 g/dL (ref 30.0–36.0)
MCV: 87.3 fL (ref 78.0–100.0)
PLATELETS: 259 10*3/uL (ref 150–400)
RBC: 4.63 MIL/uL (ref 4.22–5.81)
RDW: 13.8 % (ref 11.5–15.5)
WBC: 6.4 10*3/uL (ref 4.0–10.5)

## 2017-03-10 LAB — LIPASE, BLOOD: Lipase: 28 U/L (ref 11–51)

## 2017-03-10 LAB — BASIC METABOLIC PANEL
Anion gap: 8 (ref 5–15)
BUN: 15 mg/dL (ref 6–20)
CALCIUM: 8.6 mg/dL — AB (ref 8.9–10.3)
CO2: 27 mmol/L (ref 22–32)
CREATININE: 1.13 mg/dL (ref 0.61–1.24)
Chloride: 106 mmol/L (ref 101–111)
GFR calc Af Amer: >60 mL/min (ref >60)
GFR calc non Af Amer: >60 mL/min (ref >60)
GLUCOSE: 107 mg/dL — AB (ref 65–99)
Potassium: 3.8 mmol/L (ref 3.5–5.1)
SODIUM: 141 mmol/L (ref 135–145)

## 2017-03-10 LAB — HEPATIC FUNCTION PANEL
ALT: 26 U/L (ref 17–63)
AST: 25 U/L (ref 15–41)
Albumin: 3.8 g/dL (ref 3.5–5.0)
Alkaline Phosphatase: 46 U/L (ref 38–126)
BILIRUBIN DIRECT: 0.1 mg/dL (ref 0.1–0.5)
BILIRUBIN INDIRECT: 0.4 mg/dL (ref 0.3–0.9)
Total Bilirubin: 0.5 mg/dL (ref 0.3–1.2)
Total Protein: 6.8 g/dL (ref 6.5–8.1)

## 2017-03-10 LAB — TROPONIN I

## 2017-03-10 MED ORDER — OMEPRAZOLE 20 MG PO CPDR: 20.0000 mg | DELAYED_RELEASE_CAPSULE | Freq: Two times a day (BID) | ORAL | 0 refills | Status: DC

## 2017-03-10 MED ORDER — SUCRALFATE 1 G PO TABS
1.0000 g | ORAL_TABLET | Freq: Once | ORAL | Status: AC
  Administered 2017-03-10: 1 g via ORAL
  Filled 2017-03-10: qty 1

## 2017-03-10 MED ORDER — GI COCKTAIL ~~LOC~~
30.0000 mL | Freq: Once | ORAL | Status: AC
  Administered 2017-03-10: 30 mL via ORAL
  Filled 2017-03-10: qty 30

## 2017-03-10 MED ORDER — SUCRALFATE 1 G PO TABS: 1.0000 g | ORAL_TABLET | Freq: Three times a day (TID) | ORAL | 0 refills | Status: DC

## 2017-03-10 NOTE — ED Notes (Signed)
ED Provider at bedside. 

## 2017-03-10 NOTE — ED Provider Notes (Signed)
[facility]   CSN: [id number] Arrival date & time: 03/10/17  1055     History   Chief Complaint Chief Complaint  Patient presents with  . Chest Pain    HPI Johnny Lawrence is a 41 y.o. male.  Patient is a 41 year old male with past medical history of acid reflux and hypertension. He presents with complaints of epigastric pain. He reports this has been ongoing for the past week. It is worse when he eats. He denies any shortness of breath, diaphoresis, or radiation to the arm or jaw. He denies any exertional component.   The history is provided by the patient.  Chest Pain   This is a new problem. Episode onset: 1 week ago. Episode frequency: Intermittently. The problem has been gradually worsening. The pain is present in the substernal region. The pain is mild. The quality of the pain is described as burning. The pain does not radiate. Pertinent negatives include no cough, no diaphoresis, no exertional chest pressure and no shortness of breath. He has tried nothing for the symptoms.    Past Medical History:  Diagnosis Date  . Hypertension     There are no active problems to display for this patient.   Past Surgical History:  Procedure Laterality Date  . HERNIA REPAIR    . TESTICLE REMOVAL Right        Home Medications    Prior to Admission medications   Medication Sig Start Date End Date Taking? Authorizing Provider  amoxicillin (AMOXIL) 500 MG capsule Take 1 capsule (500 mg total) by mouth 3 (three) times daily. 11/27/16   Gwyneth SproutPlunkett, Whitney, MD  cetirizine-pseudoephedrine (ZYRTEC-D) 5-120 MG tablet Take 1 tablet by mouth daily. 11/27/16   Gwyneth SproutPlunkett, Whitney, MD  fluticasone (FLONASE) 50 MCG/ACT nasal spray Place 2 sprays into both nostrils daily. 11/27/16   Gwyneth SproutPlunkett, Whitney, MD  homatropine 5 % ophthalmic solution Place 2 drops into the right eye 3 (three) times daily. 11/04/16   Molpus, John, MD  Vitamin D, Ergocalciferol, (DRISDOL) 50000  units CAPS capsule Take 50,000 Units by mouth every 7 (seven) days.    [provider]    Family History No family history on file.  Social History Social History  Substance Use Topics  . Smoking status: Former Games developermoker  . Smokeless tobacco: Never Used  . Alcohol use Yes     Comment: occasional     Allergies   Patient has no known allergies.   Review of Systems Review of Systems  Constitutional: Negative for diaphoresis.  Respiratory: Negative for cough and shortness of breath.   Cardiovascular: Positive for chest pain.  All other systems reviewed and are negative.    Physical Exam Updated Vital Signs BP (!) 140/100 (BP Location: Right Arm)   Pulse 67   Temp 98.1 F (36.7 C) (Oral)   Resp 15   Ht 6\' 3"  (1.905 m)   Wt 115.2 kg (254 lb)   SpO2 99%   BMI 31.75 kg/m   Physical Exam  Constitutional: He is oriented to person, place, and time. He appears well-developed and well-nourished. No distress.  HENT:  Head: Normocephalic and atraumatic.  Mouth/Throat: Oropharynx is clear and moist.  Neck: Normal range of motion. Neck supple.  Cardiovascular: Normal rate and regular rhythm.  Exam reveals no friction rub.   No murmur heard. Pulmonary/Chest: Effort normal and breath sounds normal. No respiratory distress. He has no wheezes. He has no rales.  Abdominal: Soft. Bowel sounds are normal. He  exhibits no distension. There is no tenderness.  Musculoskeletal: Normal range of motion. He exhibits no edema.  Neurological: He is alert and oriented to person, place, and time. Coordination normal.  Skin: Skin is warm and dry. He is not diaphoretic.  Nursing note and vitals reviewed.    ED Treatments / Results  Labs (all labs ordered are listed, but only abnormal results are displayed) Labs Reviewed  BASIC METABOLIC PANEL  CBC  TROPONIN I    EKG  EKG Interpretation None       Radiology No results found.  Procedures Procedures (including critical  care time)  Medications Ordered in ED Medications  gi cocktail (Maalox,Lidocaine,Donnatal) (not administered)     Initial Impression / Assessment and Plan / ED Course  I have reviewed the triage vital signs and the nursing notes.  Pertinent labs & imaging results that were available during my care of the patient were reviewed by me and considered in my medical decision making (see chart for details).  Patient presents here with complaints of epigastric discomfort that I suspect is related to gastritis. He was given a GI cocktail with near complete relief. His workup reveals negative troponin, no white count, normal EKG. He will be treated with omeprazole, Carafate, and follow-up with GI if his symptoms are not improving in the next week.  Final Clinical Impressions(s) / ED Diagnoses   Final diagnoses:  None    New Prescriptions New Prescriptions   No medications on file     Geoffery Lyonselo, Jovane Foutz, MD 03/10/17 1249

## 2017-03-10 NOTE — Discharge Instructions (Signed)
Omeprazole and Carafate as prescribed.  [facility] if not improving in the next week. The contact information has been provided in this discharge summary for you to collect these arrangements.  Return to the emergency department in the meantime if your symptoms significantly worsen or change.

## 2017-03-10 NOTE — ED Triage Notes (Signed)
L side chest pain x 2 days, states eating makes it worse. Has been taking zantac without relief.

## 2017-07-21 ENCOUNTER — Other Ambulatory Visit: Payer: Self-pay

## 2017-07-21 ENCOUNTER — Emergency Department (HOSPITAL_BASED_OUTPATIENT_CLINIC_OR_DEPARTMENT_OTHER)
Admission: EM | Admit: 2017-07-21 | Discharge: 2017-07-21 | Disposition: A | Discharge status: Home / Self Care | Payer: BLUE CROSS/BLUE SHIELD | Attending: Emergency Medicine | Admitting: Emergency Medicine

## 2017-07-21 ENCOUNTER — Encounter (HOSPITAL_BASED_OUTPATIENT_CLINIC_OR_DEPARTMENT_OTHER): Payer: Self-pay | Admitting: Emergency Medicine

## 2017-07-21 DIAGNOSIS — M62838 Other muscle spasm: Secondary | ICD-10-CM

## 2017-07-21 DIAGNOSIS — M542 Cervicalgia: Secondary | ICD-10-CM

## 2017-07-21 DIAGNOSIS — Z87891 Personal history of nicotine dependence: Secondary | ICD-10-CM | POA: Insufficient documentation

## 2017-07-21 DIAGNOSIS — I10 Essential (primary) hypertension: Secondary | ICD-10-CM | POA: Insufficient documentation

## 2017-07-21 MED ORDER — CYCLOBENZAPRINE HCL 10 MG PO TABS
ORAL_TABLET | ORAL | Status: AC
  Filled 2017-07-21: qty 1

## 2017-07-21 MED ORDER — CYCLOBENZAPRINE HCL 10 MG PO TABS: 10.0000 mg | ORAL_TABLET | Freq: Two times a day (BID) | ORAL | 0 refills | Status: AC | PRN

## 2017-07-21 MED ORDER — IBUPROFEN 600 MG PO TABS: 600.0000 mg | ORAL_TABLET | Freq: Four times a day (QID) | ORAL | 0 refills | Status: DC | PRN

## 2017-07-21 MED ORDER — CYCLOBENZAPRINE HCL 10 MG PO TABS
10.0000 mg | ORAL_TABLET | Freq: Once | ORAL | Status: AC
  Administered 2017-07-21: 10 mg via ORAL

## 2017-07-21 MED ORDER — IBUPROFEN 400 MG PO TABS
600.0000 mg | ORAL_TABLET | Freq: Once | ORAL | Status: AC
  Administered 2017-07-21: 600 mg via ORAL
  Filled 2017-07-21: qty 1

## 2017-07-21 MED FILL — CYCLOBENZAPRINE 10 MG TABLE: 10 | 10 days supply | Qty: 20 | Fill #0

## 2017-07-21 NOTE — Discharge Instructions (Signed)
Ibuprofen and Tylenol for pain, as well as Flexeril for muscle spasm, this medication can make you drowsy please take at night. Continue to use heat. You can do some light stretching activity taken these medications they've started to kick in. If symptoms are not improving after several days follow-up with your primary doctor, if you have weakness, numbness or tingling in her arms, headaches, vision changes nausea, vomiting or other symptoms please return to the ED for sooner reevaluation.

## 2017-07-21 NOTE — ED Provider Notes (Signed)
[facility] EMERGENCY DEPARTMENT Provider Note   CSN: [id number] Arrival date & time: 07/21/17  1051     History   Chief Complaint Chief Complaint  Patient presents with  . Neck Pain    HPI  Johnny Lawrence is a 41 y.o. Male with a history of hypertension presents complaining of neck pain and stiffness for the past several days. Pt reports he does a lot of heavy lifting at work and thinks this likely contributes. Pt localizes pain to his neck an across the tops of both shoulders and describes it as tight an "states it feels like my upper back is in a knot". Pt reports she was shoveling snow yesterday and slipped and fell forwards and caught himself with his hands and think this made his neck pain worse. Pt denies hitting his head, no LOC, headache, blurry vision, or nausea vomiting. Pt denies any injury or pain from the fall, just increased neck pain. Pt denies trying any meds prior to arrival to treat his symptoms. Pt denies any weakness numbness or tingling in upper extremities, pain does not radiate down his arms at all. No fevers, chest pain or shortness of breath.      Past Medical History:  Diagnosis Date  . Hypertension     There are no active problems to display for this patient.   Past Surgical History:  Procedure Laterality Date  . HERNIA REPAIR    . TESTICLE REMOVAL Right        Home Medications    Prior to Admission medications   Medication Sig Start Date End Date Taking? Authorizing Provider  homatropine 5 % ophthalmic solution Place 2 drops into the right eye 3 (three) times daily. 11/04/16   Molpus, John, MD    Family History No family history on file.  Social History Social History   Tobacco Use  . Smoking status: Former Games developermoker  . Smokeless tobacco: Never Used  Substance Use Topics  . Alcohol use: Yes    Comment: occasional  . Drug use: Yes    Types: Marijuana     Allergies   Patient has no known allergies.   Review of  Systems Review of Systems  Constitutional: Negative for chills and fever.  Eyes: Negative for photophobia and visual disturbance.  Respiratory: Negative for chest tightness and shortness of breath.   Cardiovascular: Negative for chest pain.  Musculoskeletal: Positive for neck pain and neck stiffness.  Skin: Negative for color change and rash.  Neurological: Negative for dizziness, weakness, light-headedness, numbness and headaches.     Physical Exam Updated Vital Signs BP (!) 146/96 (BP Location: Left Arm)   Pulse 70   Temp 98.1 F (36.7 C) (Oral)   Resp 16   Ht 6\' 3"  (1.905 m)   Wt 119.7 kg (264 lb)   SpO2 98%   BMI 33.00 kg/m   Physical Exam  Constitutional: He appears well-developed and well-nourished. No distress.  HENT:  Head: Normocephalic and atraumatic.  Eyes: EOM are normal. Pupils are equal, round, and reactive to light. Right eye exhibits no discharge. Left eye exhibits no discharge.  Neck:  C-spine NTTP at midline, tenderness starts paraspinally and radiates out across distribution of bilateral trapezius muscles. Full ROM of neck in all directions with mild discomfort  Pulmonary/Chest: Effort normal. No respiratory distress. He exhibits no tenderness.  Musculoskeletal:  T-spine and L-spine NTTP at midline or paraspinally Normal ROM of bilateral upper extremities, bilateral radial pulses 2+  Neurological: He is alert.  Coordination normal.  Speech is clear, able to follow commands CN III-XII intact Normal strength in upper and lower extremities bilaterally including dorsiflexion and plantar flexion, strong and equal grip strength Sensation normal to light and sharp touch Moves extremities without ataxia, coordination intact  Skin: Skin is warm and dry. Capillary refill takes less than 2 seconds. No rash noted. He is not diaphoretic.  Psychiatric: He has a normal mood and affect. His behavior is normal.  Nursing note and vitals reviewed.    ED Treatments /  Results  Labs (all labs ordered are listed, but only abnormal results are displayed) Labs Reviewed - No data to display  EKG  EKG Interpretation None       Radiology No results found.  Procedures Procedures (including critical care time)  Medications Ordered in ED Medications  ibuprofen (ADVIL,MOTRIN) tablet 600 mg (600 mg Oral Given 07/21/17 1426)  cyclobenzaprine (FLEXERIL) tablet 10 mg (10 mg Oral Given 07/21/17 1426)     Initial Impression / Assessment and Plan / ED Course  I have reviewed the triage vital signs and the nursing notes.  Pertinent labs & imaging results that were available during my care of the patient were reviewed by me and considered in my medical decision making (see chart for details).  Pt presents complaining of neck pain and tension for several days, worsened after he slipped in the snow yesterday. On exam pt is well-appearing and vitals are normal. No neurologic deficits on exam. Pt has tender over both trapezius muscles with notable tension on palpation. No midline spinal tenderness, no head injury or LOC with fall. Presentation and exam suggestive of trapezius strain. Heating pad provided in ED and pt reports some improvement with this. Will treat with NSAIDs and muscle relaxer and counseled pt on continued use of heat. Pt to follow up with PCP if symptoms not improving. Return precautions discussed, pt expresses understanding and agrees with plan.  Final Clinical Impressions(s) / ED Diagnoses   Final diagnoses:  Neck pain  Trapezius muscle spasm    ED Discharge Orders        Ordered    ibuprofen (ADVIL,MOTRIN) 600 MG tablet  Every 6 hours PRN     07/21/17 1419    cyclobenzaprine (FLEXERIL) 10 MG tablet  2 times daily PRN     07/21/17 1419       Dartha LodgeFord, Malissia Rabbani N, [place]-C 07/21/17 2243    Ward, Layla MawKristen N, DO 07/27/17 2303

## 2017-07-21 NOTE — ED Notes (Signed)
Heating pack applied, pain medication offered patient did not want what was offered

## 2017-07-21 NOTE — ED Triage Notes (Signed)
Posterior neck pain for over one week.  Pt states he was out in the snow and may have strained it more.  Pt states he lifts heavy objects at work.

## 2018-08-29 IMAGING — CR DG CHEST 2V
2 series · 2 of 2 positions shown · non-contrast
Comparison: 08/12/2013

CLINICAL DATA: Chest pain

EXAM:
CHEST  2 VIEW

[w chest pa]
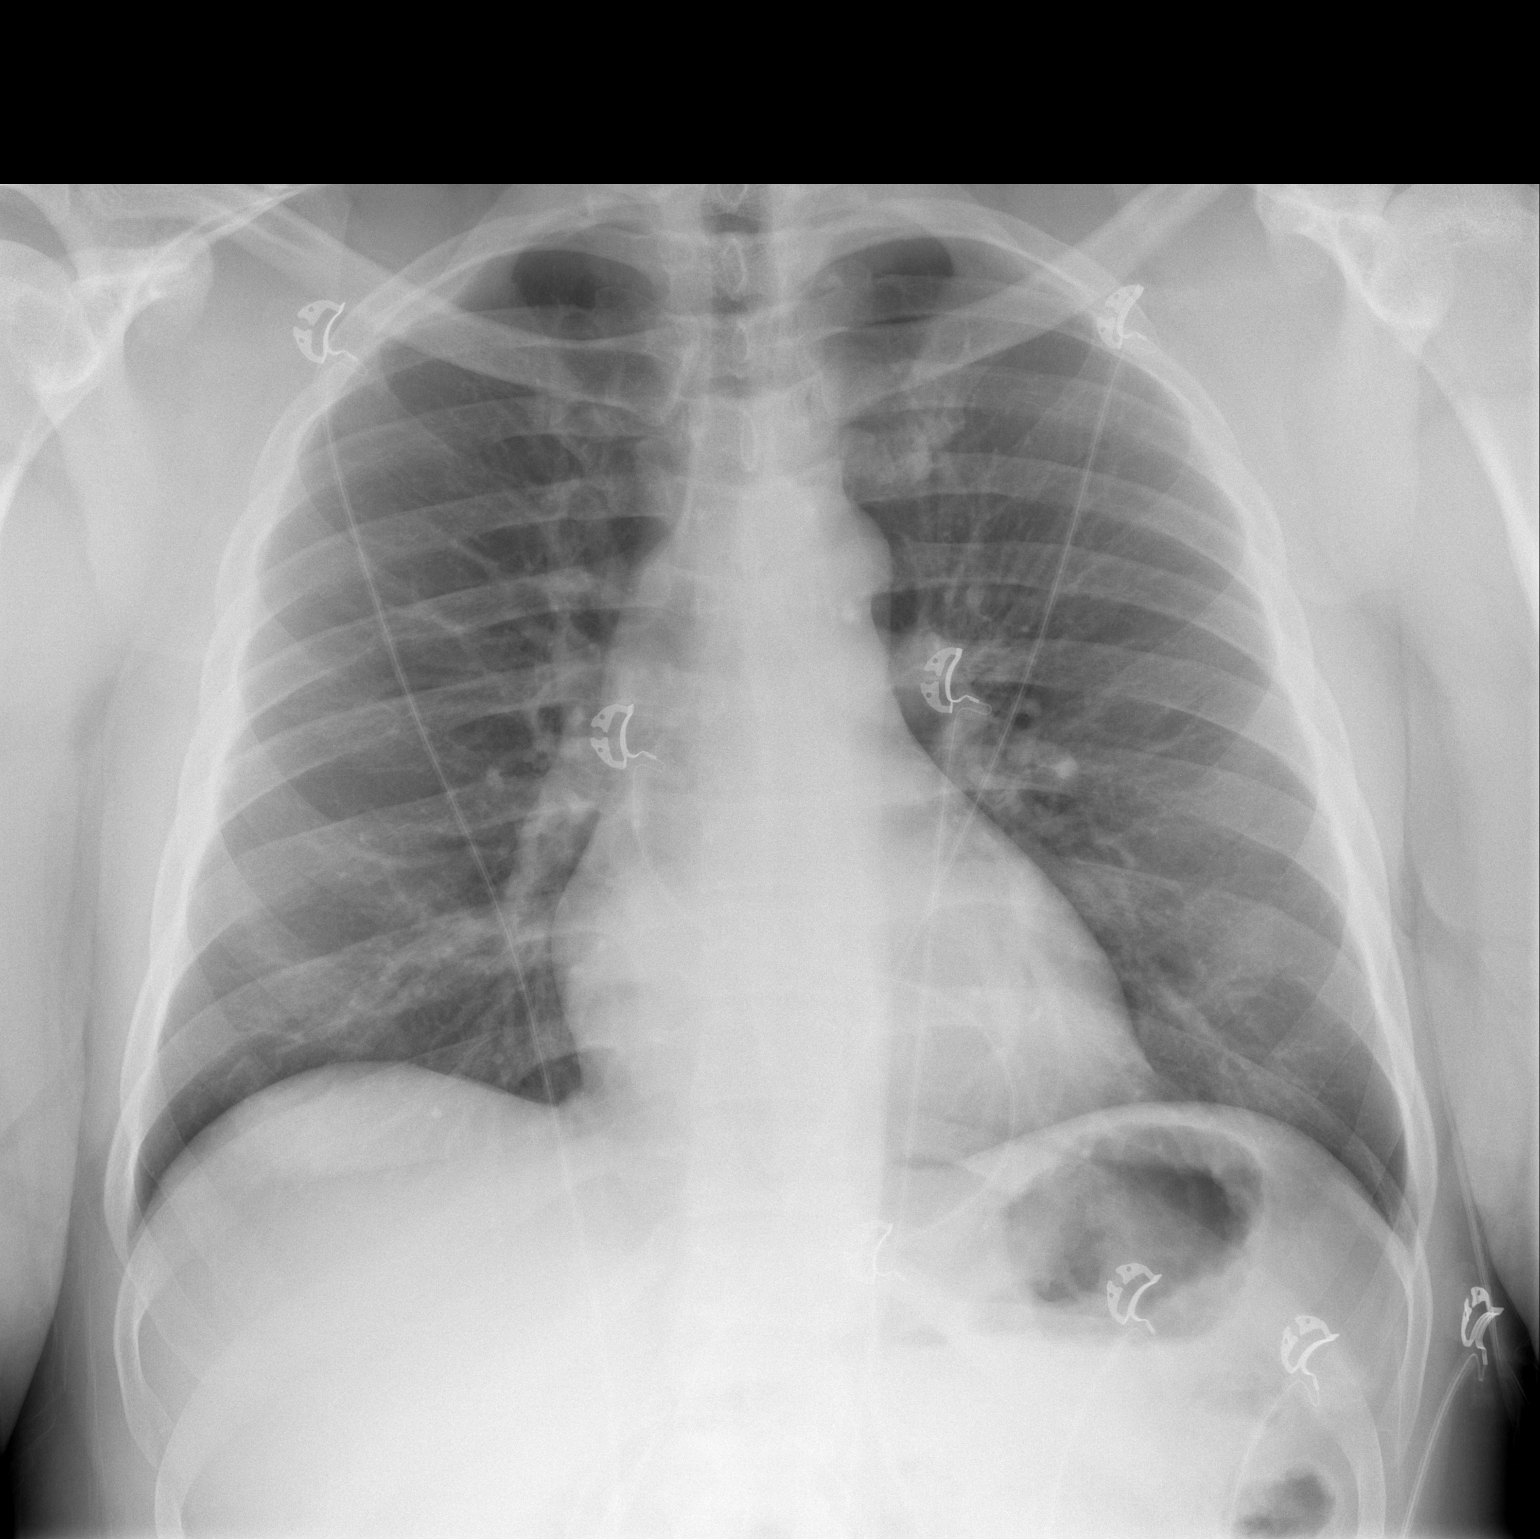

[w chest lat]
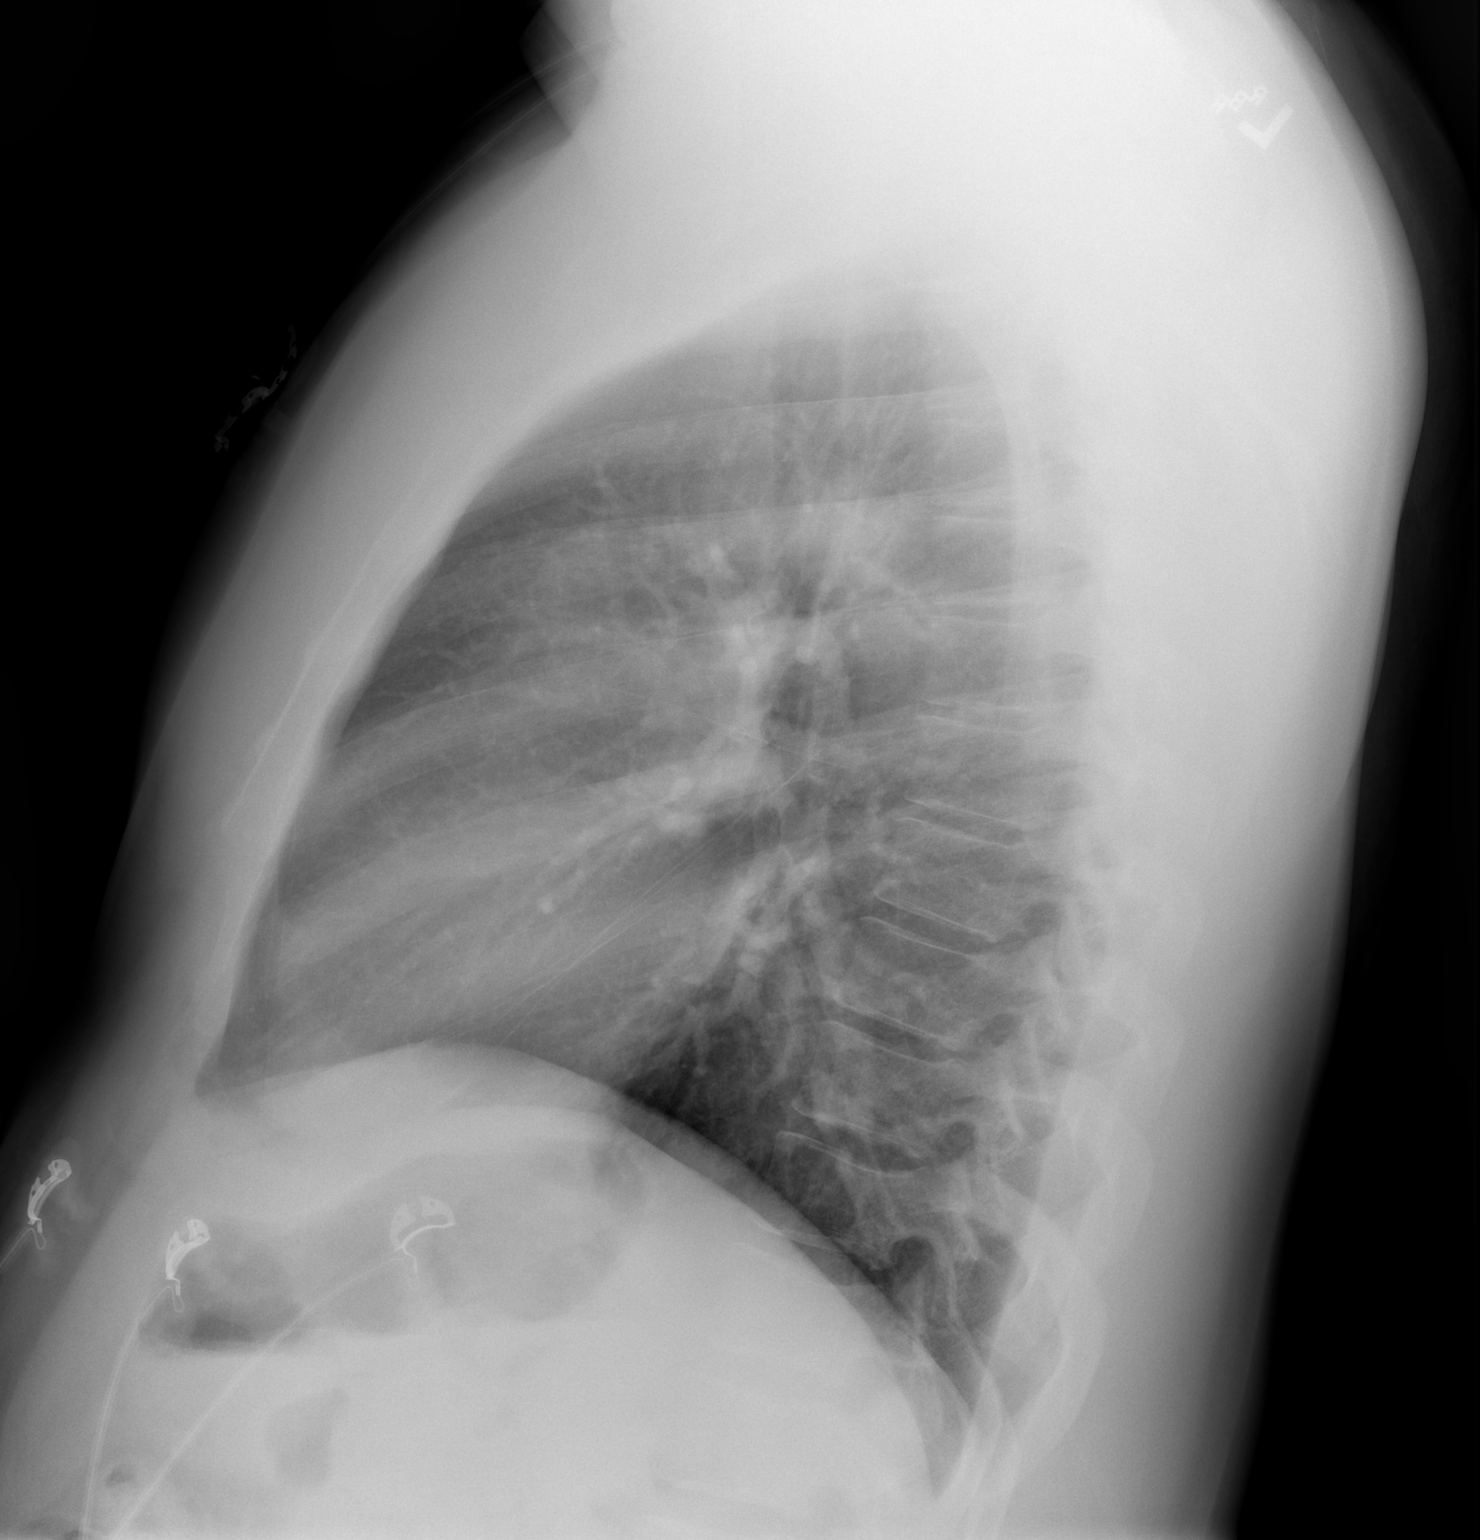

[2 of 2 positions shown; findings below may reference images not displayed]

FINDINGS: Heart and mediastinal contours are within normal limits. No focal
opacities or effusions. No acute bony abnormality.
IMPRESSION: No active cardiopulmonary disease.

## 2019-03-07 ENCOUNTER — Emergency Department (HOSPITAL_BASED_OUTPATIENT_CLINIC_OR_DEPARTMENT_OTHER)
Admission: EM | Admit: 2019-03-07 | Discharge: 2019-03-08 | Disposition: A | Discharge status: Home / Self Care | Payer: BLUE CROSS/BLUE SHIELD | Attending: Emergency Medicine | Admitting: Emergency Medicine

## 2019-03-07 ENCOUNTER — Encounter (HOSPITAL_BASED_OUTPATIENT_CLINIC_OR_DEPARTMENT_OTHER): Payer: Self-pay | Admitting: *Deleted

## 2019-03-07 ENCOUNTER — Other Ambulatory Visit: Payer: Self-pay

## 2019-03-07 DIAGNOSIS — T672XXA Heat cramp, initial encounter: Secondary | ICD-10-CM | POA: Insufficient documentation

## 2019-03-07 DIAGNOSIS — Z87891 Personal history of nicotine dependence: Secondary | ICD-10-CM | POA: Insufficient documentation

## 2019-03-07 DIAGNOSIS — Z79899 Other long term (current) drug therapy: Secondary | ICD-10-CM | POA: Insufficient documentation

## 2019-03-07 DIAGNOSIS — I10 Essential (primary) hypertension: Secondary | ICD-10-CM | POA: Insufficient documentation

## 2019-03-07 DIAGNOSIS — E860 Dehydration: Secondary | ICD-10-CM | POA: Insufficient documentation

## 2019-03-07 LAB — COMPREHENSIVE METABOLIC PANEL
ALT: 27 U/L (ref 0–44)
AST: 19 U/L (ref 15–41)
Albumin: 4.1 g/dL (ref 3.5–5.0)
Alkaline Phosphatase: 49 U/L (ref 38–126)
Anion gap: 6 (ref 5–15)
BUN: 17 mg/dL (ref 6–20)
CO2: 29 mmol/L (ref 22–32)
Calcium: 9.2 mg/dL (ref 8.9–10.3)
Chloride: 104 mmol/L (ref 98–111)
Creatinine, Ser: 0.98 mg/dL (ref 0.61–1.24)
GFR calc Af Amer: >60 mL/min (ref >60)
GFR calc non Af Amer: >60 mL/min (ref >60)
Glucose, Bld: 91 mg/dL (ref 70–99)
Potassium: 4.1 mmol/L (ref 3.5–5.1)
Sodium: 139 mmol/L (ref 135–145)
Total Bilirubin: 0.5 mg/dL (ref 0.3–1.2)
Total Protein: 7.2 g/dL (ref 6.5–8.1)

## 2019-03-07 LAB — CBC
HCT: 45.5 % (ref 39.0–52.0)
Hemoglobin: 14.2 g/dL (ref 13.0–17.0)
MCH: 28.5 pg (ref 26.0–34.0)
MCHC: 31.2 g/dL (ref 30.0–36.0)
MCV: 91.2 fL (ref 80.0–100.0)
Platelets: 282 10*3/uL (ref 150–400)
RBC: 4.99 MIL/uL (ref 4.22–5.81)
RDW: 13.5 % (ref 11.5–15.5)
WBC: 7.3 10*3/uL (ref 4.0–10.5)
nRBC: 0 % (ref 0.0–0.2)

## 2019-03-07 LAB — URINALYSIS, MICROSCOPIC (REFLEX): WBC, UA: NONE SEEN WBC/hpf (ref 0–5)

## 2019-03-07 LAB — URINALYSIS, ROUTINE W REFLEX MICROSCOPIC
Bilirubin Urine: NEGATIVE
Glucose, UA: NEGATIVE mg/dL
Ketones, ur: NEGATIVE mg/dL
Leukocytes,Ua: NEGATIVE
Nitrite: NEGATIVE
Protein, ur: NEGATIVE mg/dL
Specific Gravity, Urine: 1.025 (ref 1.005–1.030)
pH: 7 (ref 5.0–8.0)

## 2019-03-07 LAB — LIPASE, BLOOD: Lipase: 30 U/L (ref 11–51)

## 2019-03-07 MED ORDER — SODIUM CHLORIDE 0.9 % IV BOLUS
1000.0000 mL | Freq: Once | INTRAVENOUS | Status: AC
  Administered 2019-03-07: 1000 mL via INTRAVENOUS

## 2019-03-07 MED ORDER — ONDANSETRON HCL 4 MG/2ML IJ SOLN
4.0000 mg | Freq: Once | INTRAMUSCULAR | Status: AC
  Administered 2019-03-07: 4 mg via INTRAVENOUS
  Filled 2019-03-07: qty 2

## 2019-03-07 MED ORDER — SODIUM CHLORIDE 0.9% FLUSH
3.0000 mL | Freq: Once | INTRAVENOUS | Status: DC
  Filled 2019-03-07: qty 3

## 2019-03-07 NOTE — ED Notes (Signed)
Pt. At work today feeling like he got really hot and weak.  Pt. Said he got over heated and they have no air conditioners at work due to the type of work they do.  Pt. Said he left work early and just could not get his self together.

## 2019-03-07 NOTE — ED Provider Notes (Signed)
[facility] EMERGENCY DEPARTMENT Provider Note   CSN: [id number] Arrival date & time: 03/07/19  1927    History   Chief Complaint Chief Complaint  Patient presents with  . Abdominal Pain  . Diarrhea  . Sore Throat    HPI Johnny Lawrence is a 43 y.o. male.     Not feeling well for one week. Has been fatigued, had a sore throat and has had diarrhea for the last few days. 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 Was 100 degrees at work today, started to feel nausea and and became very weak. Muscles started cramping, felling increased nausea.     Past Medical History:  Diagnosis Date  . Hypertension     There are no active problems to display for this patient.   Past Surgical History:  Procedure Laterality Date  . HERNIA REPAIR    . TESTICLE REMOVAL Right         Home Medications    Prior to Admission medications   Medication Sig Start Date End Date Taking? Authorizing Provider  cyclobenzaprine (FLEXERIL) 10 MG tablet Take 1 tablet (10 mg total) by mouth 2 (two) times daily as needed for muscle spasms. 07/21/17   Dartha LodgeFord, Kelsey N, PA-C  diphenoxylate-atropine (LOMOTIL) 2.5-0.025 MG tablet Take 1-2 tablets by mouth 4 (four) times daily as needed for diarrhea or loose stools. 03/08/19   Gilda CreasePollina, Patrycja Mumpower J, MD  homatropine 5 % ophthalmic solution Place 2 drops into the right eye 3 (three) times daily. 11/04/16   Molpus, John, MD  ibuprofen (ADVIL,MOTRIN) 600 MG tablet Take 1 tablet (600 mg total) by mouth every 6 (six) hours as needed. 07/21/17   Dartha LodgeFord, Kelsey N, PA-C  ondansetron (ZOFRAN) 4 MG tablet Take 1 tablet (4 mg total) by mouth every 6 (six) hours. 03/08/19   Gilda CreasePollina, Griffey Nicasio J, MD    Family History No family history on file.  Social History Social History   Tobacco Use  . Smoking status: Former Games developermoker  . Smokeless tobacco: Never Used  Substance Use Topics  . Alcohol use: Yes    Comment: occasional  . Drug use:  Yes    Types: Marijuana     Allergies   Patient has no known allergies.   Review of Systems Review of Systems  Constitutional: Positive for fatigue.  HENT: Positive for sore throat.   Respiratory: Negative for cough.   Cardiovascular: Negative for chest pain.  Gastrointestinal: Positive for diarrhea and nausea.  Musculoskeletal: Positive for myalgias.  All other systems reviewed and are negative.    Physical Exam Updated Vital Signs BP (!) 157/91   Pulse 62   Temp 98.3 F (36.8 C) (Oral)   Resp 16   Ht 6\' 3"  (1.905 m)   Wt 117.9 kg   SpO2 99%   BMI 32.50 kg/m   Physical Exam Vitals signs and nursing note reviewed.  Constitutional:      General: He is not in acute distress.    Appearance: Normal appearance. He is well-developed.  HENT:     Head: Normocephalic and atraumatic.     Right Ear: Hearing normal.     Left Ear: Hearing normal.     Nose: Nose normal.  Eyes:     Conjunctiva/sclera: Conjunctivae normal.     Pupils: Pupils are equal, round, and reactive to light.  Neck:     Musculoskeletal: Normal range of motion and neck supple.  Cardiovascular:     Rate and  Rhythm: Regular rhythm.     Heart sounds: S1 normal and S2 normal. No murmur. No friction rub. No gallop.   Pulmonary:     Effort: Pulmonary effort is normal. No respiratory distress.     Breath sounds: Normal breath sounds.  Chest:     Chest wall: No tenderness.  Abdominal:     General: Bowel sounds are normal.     Palpations: Abdomen is soft.     Tenderness: There is no abdominal tenderness. There is no guarding or rebound. Negative signs include Murphy's sign and McBurney's sign.     Hernia: No hernia is present.  Musculoskeletal: Normal range of motion.  Skin:    General: Skin is warm and dry.     Findings: No rash.  Neurological:     Mental Status: He is alert and oriented to person, place, and time.     GCS: GCS eye subscore is 4. GCS verbal subscore is 5. GCS motor subscore is 6.      Cranial Nerves: No cranial nerve deficit.     Sensory: No sensory deficit.     Coordination: Coordination normal.  Psychiatric:        Speech: Speech normal.        Behavior: Behavior normal.        Thought Content: Thought content normal.      ED Treatments / Results  Labs (all labs ordered are listed, but only abnormal results are displayed) Labs Reviewed  URINALYSIS, ROUTINE W REFLEX MICROSCOPIC - Abnormal; Notable for the following components:      Result Value   Hgb urine dipstick SMALL (*)    All other components within normal limits  URINALYSIS, MICROSCOPIC (REFLEX) - Abnormal; Notable for the following components:   Bacteria, UA RARE (*)    All other components within normal limits  LIPASE, BLOOD  COMPREHENSIVE METABOLIC PANEL  CBC    EKG None  Radiology No results found.  Procedures Procedures (including critical care time)  Medications Ordered in ED Medications  sodium chloride 0.9 % bolus 1,000 mL (0 mLs Intravenous Stopped 03/08/19 0018)  ondansetron (ZOFRAN) injection 4 mg (4 mg Intravenous Given 03/07/19 2313)     Initial Impression / Assessment and Plan / ED Course  I have reviewed the triage vital signs and the nursing notes.  Pertinent labs & imaging results that were available during my care of the patient were reviewed by me and considered in my medical decision making (see chart for details).        She has been feeling poorly for several days.  He has had some fatigue and mild sore throat.  He developed diarrhea couple of days ago but has not had any abdominal pain.  No nausea or vomiting.  Patient reports that he got very overheated at work today.  He thinks he is dehydrated.  This is happened previously.  Vital signs are unremarkable, no fever, no tachycardia.  He is breathing without difficulty.  Lungs are clear on auscultation.  Remainder of his exam is unremarkable.  This includes a benign nontender abdomen.  No significant renal abnormality  noted.  Urine clear, no significant concentration, no signs of myoglobinuria.  Patient administered IV fluids, will continue to treat symptomatically.  Work note for 2 days.  Final Clinical Impressions(s) / ED Diagnoses   Final diagnoses:  Dehydration  Heat cramp, initial encounter    ED Discharge Orders         Ordered    diphenoxylate-atropine (  LOMOTIL) 2.5-0.025 MG tablet  4 times daily PRN     03/08/19 0057    ondansetron (ZOFRAN) 4 MG tablet  Every 6 hours     03/08/19 0057           Orpah Greek, MD 03/08/19 [phone]

## 2019-03-07 NOTE — ED Triage Notes (Signed)
Abdominal pain, diarrhea, headache, and sore throat x 2 days.

## 2019-03-08 MED ORDER — DIPHENOXYLATE-ATROPINE 2.5-0.025 MG PO TABS: 1.0000 | ORAL_TABLET | Freq: Four times a day (QID) | ORAL | 0 refills | Status: AC | PRN

## 2019-03-08 MED ORDER — ONDANSETRON HCL 4 MG PO TABS: 4.0000 mg | ORAL_TABLET | Freq: Four times a day (QID) | ORAL | 0 refills | Status: AC

## 2019-06-23 ENCOUNTER — Encounter (HOSPITAL_BASED_OUTPATIENT_CLINIC_OR_DEPARTMENT_OTHER): Payer: Self-pay | Admitting: *Deleted

## 2019-06-23 ENCOUNTER — Other Ambulatory Visit: Payer: Self-pay

## 2019-06-23 ENCOUNTER — Emergency Department (HOSPITAL_BASED_OUTPATIENT_CLINIC_OR_DEPARTMENT_OTHER)
Admission: EM | Admit: 2019-06-23 | Discharge: 2019-06-23 | Disposition: A | Discharge status: Home / Self Care | Payer: Self-pay | Attending: Emergency Medicine | Admitting: Emergency Medicine

## 2019-06-23 DIAGNOSIS — Z79899 Other long term (current) drug therapy: Secondary | ICD-10-CM | POA: Insufficient documentation

## 2019-06-23 DIAGNOSIS — Z87891 Personal history of nicotine dependence: Secondary | ICD-10-CM | POA: Insufficient documentation

## 2019-06-23 DIAGNOSIS — Z20828 Contact with and (suspected) exposure to other viral communicable diseases: Secondary | ICD-10-CM | POA: Insufficient documentation

## 2019-06-23 DIAGNOSIS — M7918 Myalgia, other site: Secondary | ICD-10-CM | POA: Insufficient documentation

## 2019-06-23 DIAGNOSIS — I10 Essential (primary) hypertension: Secondary | ICD-10-CM | POA: Insufficient documentation

## 2019-06-23 DIAGNOSIS — J069 Acute upper respiratory infection, unspecified: Secondary | ICD-10-CM

## 2019-06-23 DIAGNOSIS — R05 Cough: Secondary | ICD-10-CM | POA: Insufficient documentation

## 2019-06-23 DIAGNOSIS — J029 Acute pharyngitis, unspecified: Secondary | ICD-10-CM | POA: Insufficient documentation

## 2019-06-23 DIAGNOSIS — Z20822 Contact with and (suspected) exposure to covid-19: Secondary | ICD-10-CM

## 2019-06-23 LAB — INFLUENZA PANEL BY PCR (TYPE A & B)
Influenza A By PCR: NEGATIVE
Influenza B By PCR: NEGATIVE

## 2019-06-23 LAB — GROUP A STREP BY PCR: Group A Strep by PCR: NOT DETECTED

## 2019-06-23 MED ORDER — ACETAMINOPHEN 325 MG PO TABS
650.0000 mg | ORAL_TABLET | Freq: Once | ORAL | Status: AC
  Administered 2019-06-23: 17:00:00 650 mg via ORAL
  Filled 2019-06-23: qty 2

## 2019-06-23 NOTE — Discharge Instructions (Addendum)
You have been diagnosed today with viral upper respiratory tract infection, suspected COVID-19 virus infection.  At this time there does not appear to be the presence of an emergent medical condition, however there is always the potential for conditions to change. Please read and follow the below instructions.  Please return to the Emergency Department immediately for any new or worsening symptoms. Please be sure to follow up with your Primary Care Provider within one week regarding your visit today; please call their office to schedule an appointment even if you are feeling better for a follow-up visit. You have been tested for the COVID-19 and Flu virus today, your results will be available on your MyChart account in the next 1-2 days.  Please continue to self quarantine until symptom-free x7 days despite testing results as there is always a possibility of a false negative test.  Please be sure to drink plenty of water and get plenty of rest to avoid dehydration.  You may continue to use over-the-counter anti-inflammatory medication such as Tylenol and ibuprofen as directed on the packaging to help with your symptoms.  Get help right away if: You have trouble breathing. You have pain or pressure in your chest. You have confusion. You have bluish lips and fingernails. You have difficulty waking from sleep. You have very bad or constant: Headache. Ear pain. Pain in your forehead, behind your eyes, and over your cheekbones (sinus pain). Chest pain. You have long-lasting (chronic) lung disease along with any of these: Wheezing. Long-lasting cough. Coughing up blood. A change in your usual mucus. You have a stiff neck. You have changes in your: Vision. Hearing. Thinking. Mood. You have any new/concerning or worsening of symptoms  Please read the additional information packets attached to your discharge summary.  Do not take your medicine if  develop an itchy rash, swelling in your  mouth or lips, or difficulty breathing; call 911 and seek immediate emergency medical attention if this occurs.  Note: Portions of this text may have been transcribed using voice recognition software. Every effort was made to ensure accuracy; however, inadvertent computerized transcription errors may still be present.

## 2019-06-23 NOTE — ED Triage Notes (Signed)
Body aches, sore throat, loss of taste, and headache x 2 days. Covid exposure 2 weeks ago.

## 2019-06-23 NOTE — ED Provider Notes (Signed)
[facility] EMERGENCY DEPARTMENT Provider Note   CSN: [id number] Arrival date & time: 06/23/19  1640     History   Chief Complaint Chief Complaint  Patient presents with  . Generalized Body Aches    HPI Johnny Lawrence is a 43 y.o. male history of hypertension otherwise healthy presents today with CC of 2 days of body aches, sore throat, loss of taste and headache.  On my evaluation patient reports 3 days ago onset of generalized body aches, fatigue, sore throat and dry cough.  Symptoms gradually worsening, describes body aches as a full body mild aching sensation constant without aggravating or alleviating factors.  Fatigue as a general tiredness without focal weakness.  Sore throat as a mild scratchiness bilateral, nonradiating no aggravating or alleviating factors.  Nonproductive cough that is intermittent.  Patient has taken 1 Aleve yesterday without relief of his symptoms, no other medication use.  Of note patient reports Covid exposure approximately 2 weeks ago while at a wedding in [place].  Reports feeling warm at home but has not measured a fever.  Denies chills, headache/vision changes, neck pain, chest pain/shortness of breath, hemoptysis, abdominal pain, nausea/vomiting, diarrhea, dysuria/hematuria, extremity pain/swelling, fall/injury or any additional concerns.    HPI  Past Medical History:  Diagnosis Date  . Hypertension     There are no active problems to display for this patient.   Past Surgical History:  Procedure Laterality Date  . HERNIA REPAIR    . TESTICLE REMOVAL Right         Home Medications    Prior to Admission medications   Medication Sig Start Date End Date Taking? Authorizing Provider  cyclobenzaprine (FLEXERIL) 10 MG tablet Take 1 tablet (10 mg total) by mouth 2 (two) times daily as needed for muscle spasms. 07/21/17   Jacqlyn Larsen, PA-C  diphenoxylate-atropine (LOMOTIL) 2.5-0.025 MG tablet Take 1-2 tablets by  mouth 4 (four) times daily as needed for diarrhea or loose stools. 03/08/19   Orpah Greek, MD  homatropine 5 % ophthalmic solution Place 2 drops into the right eye 3 (three) times daily. 11/04/16   Molpus, John, MD  ibuprofen (ADVIL,MOTRIN) 600 MG tablet Take 1 tablet (600 mg total) by mouth every 6 (six) hours as needed. 07/21/17   Jacqlyn Larsen, PA-C  ondansetron (ZOFRAN) 4 MG tablet Take 1 tablet (4 mg total) by mouth every 6 (six) hours. 03/08/19   Orpah Greek, MD    Family History No family history on file.  Social History Social History   Tobacco Use  . Smoking status: Former Research scientist (life sciences)  . Smokeless tobacco: Never Used  Substance Use Topics  . Alcohol use: Yes    Comment: occasional  . Drug use: Yes    Types: Marijuana     Allergies   Patient has no known allergies.   Review of Systems Review of Systems Ten systems are reviewed and are negative for acute change except as noted in the HPI   Physical Exam Updated Vital Signs BP (!) 136/91   Pulse 79   Temp 99.1 F (37.3 C) (Oral)   Resp 20   Ht 6\' 3"  (1.905 m)   Wt 122.5 kg   SpO2 98%   BMI 33.75 kg/m   Physical Exam Constitutional:      General: He is not in acute distress.    Appearance: Normal appearance. He is well-developed. He is not ill-appearing or diaphoretic.  HENT:     Head: Normocephalic and  atraumatic.     Jaw: There is normal jaw occlusion. No trismus.     Comments: The patient has normal phonation and is in control of secretions. No stridor.  Midline uvula without edema. Soft palate rises symmetrically.  Minimal tonsillar erythema without swelling or exudates. Tongue protrusion is normal, floor of mouth is soft. No trismus. No creptius on neck palpation. No gingival erythema or fluctuance noted. Mucus membranes moist. No pallor noted.     Right Ear: External ear normal.     Left Ear: External ear normal.     Nose: Nose normal.     Mouth/Throat:     Mouth: Mucous membranes are  moist.     Pharynx: Oropharynx is clear.  Eyes:     General: Vision grossly intact. Gaze aligned appropriately.     Pupils: Pupils are equal, round, and reactive to light.  Neck:     Musculoskeletal: Normal range of motion and neck supple.     Trachea: Trachea and phonation normal. No tracheal deviation.     Meningeal: Brudzinski's sign absent.  Cardiovascular:     Rate and Rhythm: Normal rate and regular rhythm.     Pulses: Normal pulses.     Heart sounds: Normal heart sounds.  Pulmonary:     Effort: Pulmonary effort is normal. No accessory muscle usage or respiratory distress.     Breath sounds: Normal breath sounds and air entry.  Chest:     Chest wall: No tenderness.  Abdominal:     General: There is no distension.     Palpations: Abdomen is soft.     Tenderness: There is no abdominal tenderness. There is no guarding or rebound.  Musculoskeletal: Normal range of motion.  Skin:    General: Skin is warm and dry.  Neurological:     Mental Status: He is alert.     GCS: GCS eye subscore is 4. GCS verbal subscore is 5. GCS motor subscore is 6.     Comments: Speech is clear and goal oriented, follows commands Major Cranial nerves without deficit, no facial droop Moves extremities without ataxia, coordination intact  Psychiatric:        Behavior: Behavior normal.    ED Treatments / Results  Labs (all labs ordered are listed, but only abnormal results are displayed) Labs Reviewed  GROUP A STREP BY PCR  SARS CORONAVIRUS 2 (TAT 6-24 HRS)  INFLUENZA PANEL BY PCR (TYPE A & B)    EKG None  Radiology No results found.  Procedures Procedures (including critical care time)  Medications Ordered in ED Medications  acetaminophen (TYLENOL) tablet 650 mg (650 mg Oral Given 06/23/19 1715)     Initial Impression / Assessment and Plan / ED Course  I have reviewed the triage vital signs and the nursing notes.  Pertinent labs & imaging results that were available during my care  of the patient were reviewed by me and considered in my medical decision making (see chart for details).    43 year old male presents today with 3-day history of viral-like illness with sore throat, fatigue, generalized body aches and nonproductive cough.  Known Covid exposure 2 weeks ago.  He has used 1 Aleve for his symptoms yesterday without other medication use.  On initial evaluation he is well-appearing no acute distress, cranial nerves intact, no meningeal signs, heart regular rate and rhythm without murmur, lungs clear to auscultation bilaterally, abdomen soft nontender without peritoneal signs, NVI x4 extremities without signs of DVT.  Airway is patent,  normal phonation, tonsils symmetric with minimal erythema no exudate or swelling.  No uvula deviation or trismus.  No evidence of peritonsillar abscess, Ludwig's angina, RPA, dental abscess or other deep space infections additionally without abdominal tenderness or left upper quadrant masses low suspicion for mononucleosis.  High suspicion for viral illness, URI and pharyngitis.  Will strep test patient and obtain outpatient Covid test as well as influenza panel.  There is no indication for further testing or any imaging today, vital signs stable without tachycardia or hypoxia on room air, blood pressure stable, regular respiratory rate without increased work of breath, no evidence of dehydration. - Strep test negative, influenza panel and Covid test pending and will not result likely until tomorrow.  Patient appears stable for discharge there is no indication for further work-up or admission at this time.  Advised on home symptomatic therapies and to follow-up with PCP this week.  Patient reassessed resting comfortably no acute distress states understanding of care plan and is agreeable.  At this time there does not appear to be any evidence of an acute emergency medical condition and the patient appears stable for discharge with appropriate  outpatient follow up. Diagnosis was discussed with patient who verbalizes understanding of care plan and is agreeable to discharge. I have discussed return precautions with patient who verbalizes understanding of return precautions. Patient encouraged to follow-up with their PCP. All questions answered.  Patient discharged in good condition.  Patient's case discussed with Dr. Lockie Mola who agrees with plan to discharge with follow-up.   Johnny Lawrence was evaluated in Emergency Department on 06/23/2019 for the symptoms described in the history of present illness. He was evaluated in the context of the global COVID-19 pandemic, which necessitated consideration that the patient might be at risk for infection with the SARS-CoV-2 virus that causes COVID-19. Institutional protocols and algorithms that pertain to the evaluation of patients at risk for COVID-19 are in a state of rapid change based on information released by regulatory bodies including the CDC and federal and state organizations. These policies and algorithms were followed during the patient's care in the ED.  Note: Portions of this report may have been transcribed using voice recognition software. Every effort was made to ensure accuracy; however, inadvertent computerized transcription errors may still be present. Final Clinical Impressions(s) / ED Diagnoses   Final diagnoses:  Suspected COVID-19 virus infection  Upper respiratory tract infection, unspecified type    ED Discharge Orders    None       Bill Salinas, PA-C 06/23/19 1804    Virgina Norfolk, DO 06/23/19 1943

## 2019-06-24 LAB — SARS CORONAVIRUS 2 (TAT 6-24 HRS): SARS Coronavirus 2: NEGATIVE

## 2019-09-14 ENCOUNTER — Encounter (HOSPITAL_BASED_OUTPATIENT_CLINIC_OR_DEPARTMENT_OTHER): Payer: Self-pay

## 2019-09-14 ENCOUNTER — Emergency Department (HOSPITAL_BASED_OUTPATIENT_CLINIC_OR_DEPARTMENT_OTHER)
Admission: EM | Admit: 2019-09-14 | Discharge: 2019-09-14 | Disposition: A | Discharge status: Home / Self Care | Payer: Self-pay | Attending: Emergency Medicine | Admitting: Emergency Medicine

## 2019-09-14 ENCOUNTER — Other Ambulatory Visit: Payer: Self-pay

## 2019-09-14 DIAGNOSIS — Z87891 Personal history of nicotine dependence: Secondary | ICD-10-CM | POA: Insufficient documentation

## 2019-09-14 DIAGNOSIS — Z20822 Contact with and (suspected) exposure to covid-19: Secondary | ICD-10-CM | POA: Insufficient documentation

## 2019-09-14 DIAGNOSIS — I10 Essential (primary) hypertension: Secondary | ICD-10-CM | POA: Insufficient documentation

## 2019-09-14 DIAGNOSIS — J029 Acute pharyngitis, unspecified: Secondary | ICD-10-CM | POA: Insufficient documentation

## 2019-09-14 LAB — GROUP A STREP BY PCR: Group A Strep by PCR: NOT DETECTED

## 2019-09-14 NOTE — Discharge Instructions (Signed)
You were seen in the emergency department with runny nose and sore throat.  Your strep test was negative.  I am testing you for COVID-19 and will have you quarantine over the next 7 days.  You can follow the test results in the MyChart app.  There is information in the discharge paperwork about how to set this app up on your phone.  I wrote a work note for you as well.  You may take Tylenol and or Motrin as needed for pain/fever and drink plenty of fluids.  Return with any new or suddenly worsening symptoms such as chest pain or shortness of breath.

## 2019-09-14 NOTE — ED Triage Notes (Signed)
Pt c/o sore throat, runny nose-started last night-NAD-steady gait

## 2019-09-14 NOTE — ED Provider Notes (Signed)
Emergency Department Provider Note   I have reviewed the triage vital signs and the nursing notes.   HISTORY  Chief Complaint Sore Throat   HPI Johnny Lawrence is a 44 y.o. male with PMH of HTN presents to the ED with runny nose and sore throat over the last 12 hours. No known COVID exposure. No vomiting or diarrhea. No CP or SOB. No radiation of symptoms or modifying factors. Patient concerned regarding COVID diagnosis. No OTC medications taken at home.   Past Medical History:  Diagnosis Date  . Hypertension     There are no problems to display for this patient.   Past Surgical History:  Procedure Laterality Date  . HERNIA REPAIR    . TESTICLE REMOVAL Right     Allergies Patient has no known allergies.  No family history on file.  Social History Social History   Tobacco Use  . Smoking status: Former Games developer  . Smokeless tobacco: Never Used  Substance Use Topics  . Alcohol use: Yes    Comment: occasional  . Drug use: Yes    Types: Marijuana    Review of Systems  Constitutional: No fever/chills ENT: Positive sore throat. Cardiovascular: Denies chest pain. Respiratory: Denies shortness of breath. Gastrointestinal: No abdominal pain.  No nausea, no vomiting.  No diarrhea.  No constipation. Musculoskeletal: Negative for back pain. Skin: Negative for rash. Neurological: Negative for headaches.  10-point ROS otherwise negative.  ____________________________________________   PHYSICAL EXAM:  VITAL SIGNS: ED Triage Vitals  Enc Vitals Group     BP 09/14/19 1420 (!) 141/101     Pulse Rate 09/14/19 1420 63     Resp 09/14/19 1420 20     Temp 09/14/19 1420 98.7 F (37.1 C)     Temp Source 09/14/19 1420 Oral     SpO2 09/14/19 1420 98 %     Weight 09/14/19 1420 284 lb (128.8 kg)     Height 09/14/19 1420 6\' 3"  (1.905 m)   Constitutional: Alert and oriented. Well appearing and in no acute distress. Eyes: Conjunctivae are normal. Head:  Atraumatic. Nose: No congestion/rhinnorhea. Mouth/Throat: Mucous membranes are moist.  Neck: No stridor.  Cardiovascular: Normal rate, regular rhythm. Good peripheral circulation. Grossly normal heart sounds.   Respiratory: Normal respiratory effort.  No retractions. Lungs CTAB. Gastrointestinal: No distention.  Musculoskeletal: No gross deformities of extremities. Neurologic:  Normal speech and language. Skin:  Skin is warm, dry and intact. No rash noted.  ____________________________________________   LABS (all labs ordered are listed, but only abnormal results are displayed)  Labs Reviewed  GROUP A STREP BY PCR  NOVEL CORONAVIRUS, NAA (HOSP ORDER, SEND-OUT TO REF LAB; TAT 18-24 HRS)   ____________________________________________   PROCEDURES  Procedure(s) performed:   Procedures  None ____________________________________________   INITIAL IMPRESSION / ASSESSMENT AND PLAN / ED COURSE  Pertinent labs & imaging results that were available during my care of the patient were reviewed by me and considered in my medical decision making (see chart for details).   Patient with sore throat and congestion. COVID is a consideration and will send PCR. Patient to isolate and follow results in MyChart. No acute distress. No hypoxemia, increased WOB, or CP.   Johnny Lawrence was evaluated in Emergency Department on 09/15/2019 for the symptoms described in the history of present illness. He was evaluated in the context of the global COVID-19 pandemic, which necessitated consideration that the patient might be at risk for infection with the SARS-CoV-2 virus that causes COVID-19.  Institutional protocols and algorithms that pertain to the evaluation of patients at risk for COVID-19 are in a state of rapid change based on information released by regulatory bodies including the CDC and federal and state organizations. These policies and algorithms were followed during the patient's care in the  ED.  ____________________________________________  FINAL CLINICAL IMPRESSION(S) / ED DIAGNOSES  Final diagnoses:  Pharyngitis, unspecified etiology     Note:  This document was prepared using Dragon voice recognition software and may include unintentional dictation errors.  Nanda Quinton, MD, [facility] Emergency Medicine    Johnny Lawrence, Wonda Olds, MD 09/15/19 2005

## 2019-09-15 LAB — NOVEL CORONAVIRUS, NAA (HOSP ORDER, SEND-OUT TO REF LAB; TAT 18-24 HRS): SARS-CoV-2, NAA: NOT DETECTED

## 2019-10-18 ENCOUNTER — Other Ambulatory Visit: Payer: Self-pay

## 2019-10-18 ENCOUNTER — Encounter (HOSPITAL_BASED_OUTPATIENT_CLINIC_OR_DEPARTMENT_OTHER): Payer: Self-pay | Admitting: Emergency Medicine

## 2019-10-18 ENCOUNTER — Emergency Department (HOSPITAL_BASED_OUTPATIENT_CLINIC_OR_DEPARTMENT_OTHER)
Admission: EM | Admit: 2019-10-18 | Discharge: 2019-10-18 | Disposition: A | Discharge status: Home / Self Care | Payer: Self-pay | Attending: Emergency Medicine | Admitting: Emergency Medicine

## 2019-10-18 DIAGNOSIS — Z87891 Personal history of nicotine dependence: Secondary | ICD-10-CM | POA: Insufficient documentation

## 2019-10-18 DIAGNOSIS — F1210 Cannabis abuse, uncomplicated: Secondary | ICD-10-CM | POA: Insufficient documentation

## 2019-10-18 DIAGNOSIS — M542 Cervicalgia: Secondary | ICD-10-CM | POA: Insufficient documentation

## 2019-10-18 DIAGNOSIS — M25511 Pain in right shoulder: Secondary | ICD-10-CM | POA: Insufficient documentation

## 2019-10-18 DIAGNOSIS — I10 Essential (primary) hypertension: Secondary | ICD-10-CM | POA: Insufficient documentation

## 2019-10-18 MED ORDER — METHOCARBAMOL 750 MG PO TABS: 750.0000 mg | ORAL_TABLET | Freq: Two times a day (BID) | ORAL | 0 refills | Status: AC | PRN

## 2019-10-18 MED ORDER — LIDOCAINE 5 % EX PTCH: 1.0000 | MEDICATED_PATCH | CUTANEOUS | 0 refills | Status: AC

## 2019-10-18 MED FILL — METHOCARBAMOL 750 MG TABS: 750 | 10 days supply | Qty: 20 | Fill #0

## 2019-10-18 NOTE — ED Triage Notes (Signed)
Helping family move furniture 3 days ago and has been having right shoulder pain ever since.  Radiates down right arm.

## 2019-10-18 NOTE — ED Provider Notes (Signed)
[facility] EMERGENCY DEPARTMENT Provider Note   CSN: [id number] Arrival date & time: 10/18/19  1606     History Chief Complaint  Patient presents with  . Shoulder Pain    Johnny Lawrence is a 44 y.o. male.  HPI      Johnny Lawrence is a 44 y.o. male, with a history of HTN, presenting to the ED with right shoulder pain for the last several days. He states he helped someone move furniture a day or 2 before pain was noted. He describes the pain as a tightness, indicates it starts in the right trapezius, moderate, worse with movement, radiating into the right shoulder.  He has taken Tylenol without resolution.  Denies fall/trauma, numbness, weakness, shortness of breath, or any other complaints.   Past Medical History:  Diagnosis Date  . Hypertension     There are no problems to display for this patient.   Past Surgical History:  Procedure Laterality Date  . HERNIA REPAIR    . TESTICLE REMOVAL Right        No family history on file.  Social History   Tobacco Use  . Smoking status: Former Research scientist (life sciences)  . Smokeless tobacco: Never Used  Substance Use Topics  . Alcohol use: Yes    Comment: occasional  . Drug use: Yes    Types: Marijuana    Home Medications Prior to Admission medications   Medication Sig Start Date End Date Taking? Authorizing Provider  cyclobenzaprine (FLEXERIL) 10 MG tablet Take 1 tablet (10 mg total) by mouth 2 (two) times daily as needed for muscle spasms. 07/21/17   Jacqlyn Larsen, PA-C  diphenoxylate-atropine (LOMOTIL) 2.5-0.025 MG tablet Take 1-2 tablets by mouth 4 (four) times daily as needed for diarrhea or loose stools. 03/08/19   Orpah Greek, MD  homatropine 5 % ophthalmic solution Place 2 drops into the right eye 3 (three) times daily. 11/04/16   Molpus, John, MD  ibuprofen (ADVIL,MOTRIN) 600 MG tablet Take 1 tablet (600 mg total) by mouth every 6 (six) hours as needed. 07/21/17   Jacqlyn Larsen, PA-C  lidocaine  (LIDODERM) 5 % Place 1 patch onto the skin daily. Remove & Discard patch within 12 hours or as directed by MD 10/18/19   Margarie Mcguirt, Helane Gunther, PA-C  methocarbamol (ROBAXIN) 750 MG tablet Take 1 tablet (750 mg total) by mouth 2 (two) times daily as needed for muscle spasms (and muscle tightness). 10/18/19   Mathayus Stanbery C, PA-C  ondansetron (ZOFRAN) 4 MG tablet Take 1 tablet (4 mg total) by mouth every 6 (six) hours. 03/08/19   Orpah Greek, MD    Allergies    Patient has no known allergies.  Review of Systems   Review of Systems  Musculoskeletal: Positive for arthralgias and neck pain.  Neurological: Negative for weakness and numbness.    Physical Exam Updated Vital Signs BP (!) 142/94 (BP Location: Left Arm)   Pulse 74   Temp 98.9 F (37.2 C) (Oral)   Resp 16   Ht 6\' 3"  (1.905 m)   Wt 130 kg   SpO2 98%   BMI 35.81 kg/m   Physical Exam Vitals and nursing note reviewed.  Constitutional:      General: He is not in acute distress.    Appearance: He is well-developed. He is not diaphoretic.  HENT:     Head: Normocephalic and atraumatic.  Eyes:     Conjunctiva/sclera: Conjunctivae normal.  Cardiovascular:     Rate and Rhythm:  Normal rate and regular rhythm.     Pulses:          Radial pulses are 2+ on the right side and 2+ on the left side.  Pulmonary:     Effort: Pulmonary effort is normal.  Musculoskeletal:     Cervical back: Normal range of motion and neck supple.     Comments: Tenderness to the right trapezius into the right superior shoulder. Pain with range of motion of the right shoulder, however, range of motion is intact. Normal motor function intact in all extremities. No midline spinal tenderness.   Skin:    General: Skin is warm and dry.     Capillary Refill: Capillary refill takes less than 2 seconds.     Coloration: Skin is not pale.  Neurological:     Mental Status: He is alert.     Comments: Sensation grossly intact to light touch through each of the nerve  distributions of the bilateral upper extremities. Abduction and adduction of the fingers intact against resistance. Grip strength equal bilaterally. Supination and pronation intact against resistance. Strength 5/5 through the cardinal directions of the bilateral wrists. Strength 5/5 with flexion and extension of the bilateral elbows. Patient can touch the thumb to each one of the fingertips without difficulty.  Patient can hold the "OK" sign against resistance.  Psychiatric:        Behavior: Behavior normal.     ED Results / Procedures / Treatments   Labs (all labs ordered are listed, but only abnormal results are displayed) Labs Reviewed - No data to display  EKG None  Radiology No results found.  Procedures Procedures (including critical care time)  Medications Ordered in ED Medications - No data to display  ED Course  I have reviewed the triage vital signs and the nursing notes.  Pertinent labs & imaging results that were available during my care of the patient were reviewed by me and considered in my medical decision making (see chart for details).    MDM Rules/Calculators/A&P                      Patient presents with right trapezius and shoulder pain after moving furniture.  Due to the atraumatic nature of his pain I do not think imaging studies would be helpful at this time.  No evidence of neurovascular compromise. The patient was given instructions for home care as well as return precautions. Patient voices understanding of these instructions, accepts the plan, and is comfortable with discharge.   Final Clinical Impression(s) / ED Diagnoses Final diagnoses:  Acute pain of right shoulder    Rx / DC Orders ED Discharge Orders         Ordered    methocarbamol (ROBAXIN) 750 MG tablet  2 times daily PRN     10/18/19 1723    lidocaine (LIDODERM) 5 %  Every 24 hours     10/18/19 1723           Anselm Pancoast, PA-C 10/18/19 1728    Geoffery Lyons,  MD 10/18/19 [phone]

## 2019-10-18 NOTE — Discharge Instructions (Addendum)
  Take it easy, but do not lay around too much as this may make any stiffness worse.  Antiinflammatory medications: Take 600 mg of ibuprofen every 6 hours or 440 mg (over the counter dose) to 500 mg (prescription dose) of naproxen every 12 hours for the next 3 days. After this time, these medications may be used as needed for pain. Take these medications with food to avoid upset stomach. Choose only one of these medications, do not take them together. Acetaminophen (generic for Tylenol): Should you continue to have additional pain while taking the ibuprofen or naproxen, you may add in acetaminophen as needed. Your daily total maximum amount of acetaminophen from all sources should be limited to 4000mg/day for persons without liver problems, or 2000mg/day for those with liver problems. Methocarbamol: Methocarbamol (generic for Robaxin) is a muscle relaxer and can help relieve stiff muscles or muscle spasms.  Do not drive or perform other dangerous activities while taking this medication as it can cause drowsiness as well as changes in reaction time and judgement. Lidocaine patches: These are available via either prescription or over-the-counter. The over-the-counter option may be more economical one and are likely just as effective. There are multiple over-the-counter brands, such as Salonpas. Ice: May apply ice to the area over the next 24 hours for 15 minutes at a time to reduce pain, inflammation, and swelling, if present. Exercises: Be sure to perform the attached exercises starting with three times a week and working up to performing them daily. This is an essential part of preventing long term problems.  Follow up: Follow up with a primary care provider for any future management of these complaints. Be sure to follow up within 7-10 days. Return: Return to the ED should symptoms worsen.  For prescription assistance, may try using prescription discount sites or apps, such as goodrx.com 

## 2020-09-17 ENCOUNTER — Other Ambulatory Visit: Payer: Self-pay

## 2020-09-17 ENCOUNTER — Emergency Department (HOSPITAL_BASED_OUTPATIENT_CLINIC_OR_DEPARTMENT_OTHER)
Admission: EM | Admit: 2020-09-17 | Discharge: 2020-09-17 | Disposition: A | Discharge status: Home / Self Care | Payer: Commercial Managed Care - PPO | Attending: Emergency Medicine | Admitting: Emergency Medicine

## 2020-09-17 ENCOUNTER — Encounter (HOSPITAL_BASED_OUTPATIENT_CLINIC_OR_DEPARTMENT_OTHER): Payer: Self-pay | Admitting: Emergency Medicine

## 2020-09-17 ENCOUNTER — Emergency Department (HOSPITAL_BASED_OUTPATIENT_CLINIC_OR_DEPARTMENT_OTHER): Payer: Commercial Managed Care - PPO

## 2020-09-17 DIAGNOSIS — I10 Essential (primary) hypertension: Secondary | ICD-10-CM | POA: Diagnosis not present

## 2020-09-17 DIAGNOSIS — X58XXXA Exposure to other specified factors, initial encounter: Secondary | ICD-10-CM | POA: Insufficient documentation

## 2020-09-17 DIAGNOSIS — S46912A Strain of unspecified muscle, fascia and tendon at shoulder and upper arm level, left arm, initial encounter: Secondary | ICD-10-CM | POA: Diagnosis not present

## 2020-09-17 DIAGNOSIS — Z87891 Personal history of nicotine dependence: Secondary | ICD-10-CM | POA: Diagnosis not present

## 2020-09-17 DIAGNOSIS — Y9344 Activity, trampolining: Secondary | ICD-10-CM | POA: Insufficient documentation

## 2020-09-17 DIAGNOSIS — S4992XA Unspecified injury of left shoulder and upper arm, initial encounter: Secondary | ICD-10-CM | POA: Diagnosis present

## 2020-09-17 NOTE — ED Triage Notes (Signed)
Fell on left shoulder on a trampoline on Friday. Continues to have pain.

## 2020-09-17 NOTE — ED Provider Notes (Signed)
[facility] EMERGENCY DEPARTMENT Provider Note   CSN: [id number] Arrival date & time: 09/17/20  0630     History Chief Complaint  Patient presents with  . Shoulder Pain    Johnny Lawrence is a 45 y.o. male.  Patient with an injury to his left shoulder on Friday.  Was jumping on a trampoline with his daughter.  Continues to have pain.  Patient states that the pain is at the top of the shoulder and somewhat to the back.  Little bit rate limited range of motion on raising his arm above his head.  Denies any numbness or weakness to his fingers.  No other injury.        Past Medical History:  Diagnosis Date  . Hypertension     There are no problems to display for this patient.   Past Surgical History:  Procedure Laterality Date  . HERNIA REPAIR    . TESTICLE REMOVAL Right        History reviewed. No pertinent family history.  Social History   Tobacco Use  . Smoking status: Former Games developer  . Smokeless tobacco: Never Used  Vaping Use  . Vaping Use: Never used  Substance Use Topics  . Alcohol use: Yes    Comment: occasional  . Drug use: Yes    Types: Marijuana    Home Medications Prior to Admission medications   Medication Sig Start Date End Date Taking? Authorizing Provider  cyclobenzaprine (FLEXERIL) 10 MG tablet Take 1 tablet (10 mg total) by mouth 2 (two) times daily as needed for muscle spasms. 07/21/17   Dartha Lodge, PA-C  diphenoxylate-atropine (LOMOTIL) 2.5-0.025 MG tablet Take 1-2 tablets by mouth 4 (four) times daily as needed for diarrhea or loose stools. 03/08/19   Gilda Crease, MD  homatropine 5 % ophthalmic solution Place 2 drops into the right eye 3 (three) times daily. 11/04/16   Molpus, John, MD  ibuprofen (ADVIL,MOTRIN) 600 MG tablet Take 1 tablet (600 mg total) by mouth every 6 (six) hours as needed. 07/21/17   Dartha Lodge, PA-C  lidocaine (LIDODERM) 5 % Place 1 patch onto the skin daily. Remove & Discard patch within 12  hours or as directed by MD 10/18/19   Joy, Hillard Danker, PA-C  methocarbamol (ROBAXIN) 750 MG tablet Take 1 tablet (750 mg total) by mouth 2 (two) times daily as needed for muscle spasms (and muscle tightness). 10/18/19   Joy, Shawn C, PA-C  ondansetron (ZOFRAN) 4 MG tablet Take 1 tablet (4 mg total) by mouth every 6 (six) hours. 03/08/19   Gilda Crease, MD    Allergies    Patient has no known allergies.  Review of Systems   Review of Systems  Constitutional: Negative for chills and fever.  HENT: Negative for congestion, rhinorrhea and sore throat.   Eyes: Negative for visual disturbance.  Respiratory: Negative for cough and shortness of breath.   Cardiovascular: Negative for chest pain and leg swelling.  Gastrointestinal: Negative for abdominal pain, diarrhea, nausea and vomiting.  Genitourinary: Negative for dysuria.  Musculoskeletal: Negative for back pain, joint swelling and neck pain.  Skin: Negative for rash.  Neurological: Negative for dizziness, weakness, light-headedness, numbness and headaches.  Hematological: Does not bruise/bleed easily.  Psychiatric/Behavioral: Negative for confusion.    Physical Exam Updated Vital Signs BP (!) 162/99   Pulse 67   Temp (!) 97 F (36.1 C)   Resp 16   SpO2 98%   Physical Exam Vitals and nursing  note reviewed.  Constitutional:      Appearance: Normal appearance. He is well-developed and well-nourished.  HENT:     Head: Normocephalic and atraumatic.  Eyes:     Conjunctiva/sclera: Conjunctivae normal.     Pupils: Pupils are equal, round, and reactive to light.  Cardiovascular:     Rate and Rhythm: Normal rate and regular rhythm.     Heart sounds: No murmur heard.   Pulmonary:     Effort: Pulmonary effort is normal. No respiratory distress.     Breath sounds: Normal breath sounds.  Abdominal:     Palpations: Abdomen is soft.     Tenderness: There is no abdominal tenderness.  Musculoskeletal:        General: No swelling,  deformity or edema.     Cervical back: Neck supple.     Comments: Radial pulse left arm 2+.  Good range of motion of fingers sensation intact.  Good movement at wrist elbow.  Limited range of motion at the left shoulder.  No obvious deformity.  Particularly limited range of motion with raising his arm above his head  Skin:    General: Skin is warm and dry.     Capillary Refill: Capillary refill takes less than 2 seconds.  Neurological:     General: No focal deficit present.     Mental Status: He is alert and oriented to person, place, and time.     Cranial Nerves: No cranial nerve deficit.     Sensory: No sensory deficit.     Motor: No weakness.  Psychiatric:        Mood and Affect: Mood and affect normal.     ED Results / Procedures / Treatments   Labs (all labs ordered are listed, but only abnormal results are displayed) Labs Reviewed - No data to display  EKG None  Radiology DG Shoulder Left  Result Date: 09/17/2020 CLINICAL DATA:  Pain following fall EXAM: LEFT SHOULDER - 2+ VIEW COMPARISON:  None. FINDINGS: Oblique, Y scapular, and axillary images were obtained. There is no fracture or dislocation. There is mild narrowing of the acromioclavicular joint. The glenohumeral joint appears normal. No erosive change. Visualized left lung clear. IMPRESSION: No fracture or dislocation.  Mild narrowing glenohumeral joint. Electronically Signed   By: Bretta Bang III M.D.   On: 09/17/2020 07:56    Procedures Procedures   Medications Ordered in ED Medications - No data to display  ED Course  I have reviewed the triage vital signs and the nursing notes.  Pertinent labs & imaging results that were available during my care of the patient were reviewed by me and considered in my medical decision making (see chart for details).    MDM Rules/Calculators/A&P                           Symptoms suggestive of possible rotator cuff injury.  X-ray does show some narrowing of the  lateral hemorrhoid joint.  But noted deformity otherwise no dislocation no fracture.  Will treat with sling anti-inflammatories and follow-up with sports medicine.  Work note provided as well.    Final Clinical Impression(s) / ED Diagnoses Final diagnoses:  Strain of left shoulder, initial encounter    Rx / DC Orders ED Discharge Orders    None       Vanetta Mulders, MD 09/17/20 [phone]

## 2020-09-17 NOTE — Discharge Instructions (Signed)
X-ray of the left shoulder shows no dislocation or fracture.  There is some narrowing of the joint line.  So it is possible there could be some rotator cuff injury.  Wear the sling as directed.  Make an appointment to follow-up with sports medicine information provided call them for follow-up.  Recommend taking over-the-counter Advil or Motrin for the pain.  Work note provided.

## 2020-09-26 ENCOUNTER — Telehealth: Payer: Self-pay | Admitting: Family Medicine

## 2020-09-26 NOTE — Telephone Encounter (Signed)
Called pt to offer ED f/u appt w/provider--no answer, unable to leave msg--call back later.--glh

## 2021-01-24 ENCOUNTER — Encounter (HOSPITAL_BASED_OUTPATIENT_CLINIC_OR_DEPARTMENT_OTHER): Payer: Self-pay

## 2021-01-24 ENCOUNTER — Emergency Department (HOSPITAL_BASED_OUTPATIENT_CLINIC_OR_DEPARTMENT_OTHER): Payer: Commercial Managed Care - PPO

## 2021-01-24 ENCOUNTER — Emergency Department (HOSPITAL_BASED_OUTPATIENT_CLINIC_OR_DEPARTMENT_OTHER)
Admission: EM | Admit: 2021-01-24 | Discharge: 2021-01-24 | Disposition: A | Discharge status: Home / Self Care | Payer: Commercial Managed Care - PPO | Attending: Emergency Medicine | Admitting: Emergency Medicine

## 2021-01-24 ENCOUNTER — Other Ambulatory Visit: Payer: Self-pay

## 2021-01-24 DIAGNOSIS — Y92213 High school as the place of occurrence of the external cause: Secondary | ICD-10-CM | POA: Insufficient documentation

## 2021-01-24 DIAGNOSIS — W228XXA Striking against or struck by other objects, initial encounter: Secondary | ICD-10-CM | POA: Insufficient documentation

## 2021-01-24 DIAGNOSIS — Z87891 Personal history of nicotine dependence: Secondary | ICD-10-CM | POA: Diagnosis not present

## 2021-01-24 DIAGNOSIS — M25561 Pain in right knee: Secondary | ICD-10-CM | POA: Insufficient documentation

## 2021-01-24 DIAGNOSIS — R2689 Other abnormalities of gait and mobility: Secondary | ICD-10-CM | POA: Insufficient documentation

## 2021-01-24 DIAGNOSIS — I10 Essential (primary) hypertension: Secondary | ICD-10-CM | POA: Insufficient documentation

## 2021-01-24 MED ORDER — IBUPROFEN 800 MG PO TABS: 800.0000 mg | ORAL_TABLET | Freq: Three times a day (TID) | ORAL | 0 refills | Status: AC | PRN

## 2021-01-24 MED ORDER — DICLOFENAC SODIUM 1 % EX GEL: 2.0000 g | Freq: Four times a day (QID) | CUTANEOUS | 0 refills | Status: AC | PRN

## 2021-01-24 NOTE — ED Provider Notes (Signed)
Emergency Department Provider Note   I have reviewed the triage vital signs and the nursing notes.   HISTORY  Chief Complaint Knee Injury   HPI Johnny Lawrence is a 45 y.o. male presents to the ED with right knee pain for the last 2 weeks. He notes a remote injury to the knee from high school that occasionally bothers him but 2 weeks prior hit the knee on some furniture and has had pain since.  He has been able to go to work but has a limping gait and soreness.  No falls.  Denies pain into the ankle or hip.  No fevers or chills.  No numbness or weakness.    Past Medical History:  Diagnosis Date   Hypertension     There are no problems to display for this patient.   Past Surgical History:  Procedure Laterality Date   HERNIA REPAIR     TESTICLE REMOVAL Right     Allergies Patient has no known allergies.  No family history on file.  Social History Social History   Tobacco Use   Smoking status: Former    Pack years: 0.00   Smokeless tobacco: Never  Vaping Use   Vaping Use: Never used  Substance Use Topics   Alcohol use: Yes    Comment: occasional   Drug use: Yes    Types: Marijuana    Review of Systems  Constitutional: No fever/chills Eyes: No visual changes. ENT: No sore throat. Cardiovascular: Denies chest pain. Respiratory: Denies shortness of breath. Gastrointestinal: No abdominal pain.  No nausea, no vomiting.  No diarrhea.  No constipation. Genitourinary: Negative for dysuria. Musculoskeletal: Negative for back pain. Positive right knee pain.  Skin: Negative for rash. Neurological: Negative for headaches, focal weakness or numbness.  10-point ROS otherwise negative.  ____________________________________________   PHYSICAL EXAM:  VITAL SIGNS: ED Triage Vitals  Enc Vitals Group     BP 01/24/21 1523 129/87     Pulse Rate 01/24/21 1523 91     Resp 01/24/21 1523 20     Temp 01/24/21 1523 98.5 F (36.9 C)     Temp Source 01/24/21 1523  Oral     SpO2 01/24/21 1523 97 %     Weight 01/24/21 1524 281 lb (127.5 kg)     Height 01/24/21 1524 6\' 3"  (1.905 m)   Constitutional: Alert and oriented. Well appearing and in no acute distress. Eyes: Conjunctivae are normal.  Head: Atraumatic. Nose: No congestion/rhinnorhea. Mouth/Throat: Mucous membranes are moist.   Neck: No stridor.   Cardiovascular: Good peripheral circulation.  Respiratory: Normal respiratory effort.  Gastrointestinal: No distention.  Musculoskeletal: Mild swelling over the right knee without erythema or warmth.  No ankle tenderness.  Normal range of motion of the right hip. No joint laxity.  Neurologic:  Normal speech and language. Normal sensation in the right lower leg.  Skin:  Skin is warm, dry and intact. No rash noted.  ____________________________________________  RADIOLOGY  Plain films reviewed.   ____________________________________________   PROCEDURES  Procedure(s) performed:   Procedures  None  ____________________________________________   INITIAL IMPRESSION / ASSESSMENT AND PLAN / ED COURSE  Pertinent labs & imaging results that were available during my care of the patient were reviewed by me and considered in my medical decision making (see chart for details).   Patient presents to the emergency department with right knee pain after injury 2 weeks ago.  He is ambulatory.  He has a moderate effusion on x-ray but no bony  abnormality.  The knee is not clinically septic.  Doubt inflammatory arthritis.  Suspect ligamentous injury versus meniscal injury leading to effusion.  Plan for RICE therapy and sports med referral/follow up.    ____________________________________________  FINAL CLINICAL IMPRESSION(S) / ED DIAGNOSES  Final diagnoses:  Acute pain of right knee    NEW OUTPATIENT MEDICATIONS STARTED DURING THIS VISIT:  Discharge Medication List as of 01/24/2021  4:07 PM     START taking these medications   Details   diclofenac Sodium (VOLTAREN) 1 % GEL Apply 2 g topically 4 (four) times daily as needed., Starting Thu 01/24/2021, Normal        Note:  This document was prepared using Dragon voice recognition software and may include unintentional dictation errors.  Alona Bene, MD, [facility]    Rejeana Fadness, Arlyss Repress, MD 01/26/21 1038

## 2021-01-24 NOTE — Discharge Instructions (Addendum)
You were seen in the emergency department today with knee pain.  Your x-ray shows some fluid on your knee but no bony injury.  I have given you the contact information for a sports medicine doctor here in the building.  Please call for follow-up.  Return to the emergency department with any new or suddenly worsening symptoms, fever, redness to the joint.

## 2021-01-24 NOTE — ED Triage Notes (Signed)
Pt reports injury to right knee 2 weeks ago-NAD-limping gait

## 2021-10-21 ENCOUNTER — Emergency Department (HOSPITAL_BASED_OUTPATIENT_CLINIC_OR_DEPARTMENT_OTHER)
Admission: EM | Admit: 2021-10-21 | Discharge: 2021-10-21 | Disposition: A | Discharge status: Home / Self Care | Payer: Commercial Managed Care - PPO | Attending: Emergency Medicine | Admitting: Emergency Medicine

## 2021-10-21 ENCOUNTER — Encounter (HOSPITAL_BASED_OUTPATIENT_CLINIC_OR_DEPARTMENT_OTHER): Payer: Self-pay | Admitting: Emergency Medicine

## 2021-10-21 ENCOUNTER — Other Ambulatory Visit: Payer: Self-pay

## 2021-10-21 DIAGNOSIS — M25461 Effusion, right knee: Secondary | ICD-10-CM

## 2021-10-21 DIAGNOSIS — I10 Essential (primary) hypertension: Secondary | ICD-10-CM | POA: Insufficient documentation

## 2021-10-21 DIAGNOSIS — M25561 Pain in right knee: Secondary | ICD-10-CM | POA: Insufficient documentation

## 2021-10-21 MED ORDER — IBUPROFEN 600 MG PO TABS: 600.0000 mg | ORAL_TABLET | Freq: Three times a day (TID) | ORAL | 0 refills | Status: AC | PRN

## 2021-10-21 NOTE — ED Provider Notes (Signed)
?[facility] EMERGENCY DEPARTMENT ?Provider Note ? ? ?CSN: [id number] ?Arrival date & time: 10/21/21  0831 ? ?  ? ?History ? ?Chief Complaint  ?Patient presents with  ? Knee Pain  ? ? ?Johnny Lawrence is a 46 y.o. male presenting to emergency department with pain in his right knee.  This been intermittent for the past several months or years.  He had an x-ray of the right knee performed in June 2022 per my review which showed a moderate knee effusion but no significant arthritis at the time.  He reports that his knee has been swelling up for the past few days, says it is "the cold weather".  He also feels that it is worse at work and he spends a lot of time both standing on his knees and kneeling at work.  He sometimes has pains in the left knee but never as bad as the right.  He denies fevers or chills.  He denies history of gout.  He denies any recent trauma. ? ?HPI ? ?  ? ?Home Medications ?Prior to Admission medications   ?Medication Sig Start Date End Date Taking? Authorizing Provider  ?ibuprofen (ADVIL) 600 MG tablet Take 1 tablet (600 mg total) by mouth every 8 (eight) hours as needed for up to 30 doses for mild pain or moderate pain. 10/21/21  Yes Chad Donoghue, Carola Rhine, MD  ?cyclobenzaprine (FLEXERIL) 10 MG tablet Take 1 tablet (10 mg total) by mouth 2 (two) times daily as needed for muscle spasms. 07/21/17   Jacqlyn Larsen, PA-C  ?diclofenac Sodium (VOLTAREN) 1 % GEL Apply 2 g topically 4 (four) times daily as needed. 01/24/21   Long, Wonda Olds, MD  ?diphenoxylate-atropine (LOMOTIL) 2.5-0.025 MG tablet Take 1-2 tablets by mouth 4 (four) times daily as needed for diarrhea or loose stools. 03/08/19   Orpah Greek, MD  ?homatropine 5 % ophthalmic solution Place 2 drops into the right eye 3 (three) times daily. 11/04/16   Molpus, John, MD  ?ibuprofen (ADVIL) 800 MG tablet Take 1 tablet (800 mg total) by mouth every 8 (eight) hours as needed for moderate pain. 01/24/21   Long, Wonda Olds, MD  ?lidocaine  (LIDODERM) 5 % Place 1 patch onto the skin daily. Remove & Discard patch within 12 hours or as directed by MD 10/18/19   Arlean Hopping C, PA-C  ?methocarbamol (ROBAXIN) 750 MG tablet Take 1 tablet (750 mg total) by mouth 2 (two) times daily as needed for muscle spasms (and muscle tightness). 10/18/19   Joy, Shawn C, PA-C  ?ondansetron (ZOFRAN) 4 MG tablet Take 1 tablet (4 mg total) by mouth every 6 (six) hours. 03/08/19   Orpah Greek, MD  ?   ? ?Allergies    ?Patient has no known allergies.   ? ?Review of Systems   ?Review of Systems ? ?Physical Exam ?Updated Vital Signs ?BP (!) 163/107 (BP Location: Right Arm)   Pulse 69   Temp 98.3 ?F (36.8 ?C) (Oral)   Resp 20   Ht 6\' 3"  (1.905 m)   Wt 131.1 kg   SpO2 97%   BMI 36.12 kg/m?  ?Physical Exam ?Constitutional:   ?   General: He is not in acute distress. ?HENT:  ?   Head: Normocephalic and atraumatic.  ?Eyes:  ?   Conjunctiva/sclera: Conjunctivae normal.  ?   Pupils: Pupils are equal, round, and reactive to light.  ?Cardiovascular:  ?   Rate and Rhythm: Normal rate and regular rhythm.  ?Pulmonary:  ?  Effort: Pulmonary effort is normal. No respiratory distress.  ?Musculoskeletal:  ?   Comments: Moderate right peripatellar knee effusion ?Full passive range of motion of the right knee but limited active range of motion due to pain. ?No warmth of the overlying effusion  ?Skin: ?   General: Skin is warm and dry.  ?Neurological:  ?   General: No focal deficit present.  ?   Mental Status: He is alert. Mental status is at baseline.  ?Psychiatric:     ?   Mood and Affect: Mood normal.     ?   Behavior: Behavior normal.  ? ? ?ED Results / Procedures / Treatments   ?Labs ?(all labs ordered are listed, but only abnormal results are displayed) ?Labs Reviewed - No data to display ? ?EKG ?None ? ?Radiology ?No results found. ? ?Procedures ?Procedures  ? ? ?Medications Ordered in ED ?Medications - No data to display ? ?ED Course/ Medical Decision Making/ A&P ?  ?                         ?Medical Decision Making ?Risk ?Prescription drug management. ? ? ?Patient is here with pain in his right knee, I suspect may be a bursitis from overuse or kneeling in the workplace.  Lower suspicion for septic arthritis or gout as there is no warmth or overlying erythema of the knee.  Low suspicion for traumatic injury or fracture as there is no history to suggest this.  I do not believe he needs repeat imaging of the knee at this time.  I do not suspect a septic joint, do not believe needs an emergent aspiration.  I did recommend that he take ibuprofen as an anti-inflammatory regularly for the next several days, which may help him out more at home.  We also talked about a support sleeve knee brace for some extra stability at work.  We will refer him to see a sports medicine physician given that the symptoms have been ongoing for several months, and appear to be interfering with his ability to work.  Patient verbalized understanding and agreement with the plan ? ? ? ? ? ? ? ?Final Clinical Impression(s) / ED Diagnoses ?Final diagnoses:  ?Acute pain of right knee  ?Effusion of bursa of right knee  ?Hypertension, unspecified type  ? ? ?Rx / DC Orders ?ED Discharge Orders   ? ?      Ordered  ?  ibuprofen (ADVIL) 600 MG tablet  Every 8 hours PRN       ? 10/21/21 0908  ? ?  ?  ? ?  ? ? ?  ?Wyvonnia Dusky, MD ?10/21/21 [phone] ? ?

## 2021-10-21 NOTE — ED Triage Notes (Signed)
Patient arrives ambulatory c/o right knee pain/ swelling intermittently over the past week. States when it gets cold or rains it will swell. Reports history of knee injury years ago.  ?

## 2021-11-22 ENCOUNTER — Emergency Department (HOSPITAL_BASED_OUTPATIENT_CLINIC_OR_DEPARTMENT_OTHER)
Admission: EM | Admit: 2021-11-22 | Discharge: 2021-11-22 | Disposition: A | Discharge status: Home / Self Care | Payer: Self-pay | Attending: Emergency Medicine | Admitting: Emergency Medicine

## 2021-11-22 ENCOUNTER — Other Ambulatory Visit: Payer: Self-pay

## 2021-11-22 ENCOUNTER — Encounter (HOSPITAL_BASED_OUTPATIENT_CLINIC_OR_DEPARTMENT_OTHER): Payer: Self-pay

## 2021-11-22 ENCOUNTER — Emergency Department (HOSPITAL_BASED_OUTPATIENT_CLINIC_OR_DEPARTMENT_OTHER): Payer: Self-pay

## 2021-11-22 DIAGNOSIS — M25561 Pain in right knee: Secondary | ICD-10-CM

## 2021-11-22 DIAGNOSIS — M25461 Effusion, right knee: Secondary | ICD-10-CM | POA: Insufficient documentation

## 2021-11-22 LAB — CBG MONITORING, ED: Glucose-Capillary: 92 mg/dL (ref 70–99)

## 2021-11-22 MED ORDER — PREDNISONE 20 MG PO TABS: 40.0000 mg | ORAL_TABLET | Freq: Every day | ORAL | 0 refills | Status: AC

## 2021-11-22 NOTE — Discharge Instructions (Signed)
Your x-ray today was negative for any changes in your bones, however it did note that there is a moderate-sized fluid buildup within your joint.  As we discussed, I think the best option for you is to see an orthopedic or sports medicine physician who may be able to either give you a joint injection or get better imaging to evaluate what is going on within your knee. ? ?I have given you a short course of oral steroids, however there is a good chance that this may not be effective in eradicating your knee pain.  Continue icing, elevating and using the diclofenac gel as needed.  Wear the knee brace every day. ?

## 2021-11-22 NOTE — ED Triage Notes (Signed)
Patient has been seen multiple times for knee swelling. Patient states the swelling is in alternating knees. Today's swelling is in his right knee.  ?

## 2021-11-23 NOTE — ED Provider Notes (Signed)
?[facility] ?Provider Note ? ? ?CSN: [id number] ?Arrival date & time: 11/22/21  1521 ? ?  ? ?History ? ?Chief Complaint  ?Patient presents with  ? Knee Pain  ? ? ?Johnny Lawrence is a 46 y.o. male who presents to the ED for evaluation of right knee pain and swelling.  This has been an ongoing issue for him for several months, this is his fourth visit to the ER for similar complaints.  He notes that the pain and swelling sometimes alternates knees but has been aggravating his right knee more often than not lately.  Most recently he was seen 10/21/2021 symptoms respected to be due to a bursitis.  He was given sports medicine referral although he did not follow-up.Patient states that he has been using a knee sleeve, taking Tylenol Motrin without significant improvement.  He stands for long periods of time at work and is having trouble working secondary to pain.  He denies numbness and tingling.  No previous history of gout.  Denies fever or chills or recent trauma. ? ? ?Knee Pain ? ?  ? ?Home Medications ?Prior to Admission medications   ?Medication Sig Start Date End Date Taking? Authorizing Provider  ?predniSONE (DELTASONE) 20 MG tablet Take 2 tablets (40 mg total) by mouth daily for 5 days. 11/22/21 11/27/21 Yes Kathe Becton R, PA-C  ?cyclobenzaprine (FLEXERIL) 10 MG tablet Take 1 tablet (10 mg total) by mouth 2 (two) times daily as needed for muscle spasms. 07/21/17   Jacqlyn Larsen, PA-C  ?diclofenac Sodium (VOLTAREN) 1 % GEL Apply 2 g topically 4 (four) times daily as needed. 01/24/21   Long, Wonda Olds, MD  ?diphenoxylate-atropine (LOMOTIL) 2.5-0.025 MG tablet Take 1-2 tablets by mouth 4 (four) times daily as needed for diarrhea or loose stools. 03/08/19   Orpah Greek, MD  ?homatropine 5 % ophthalmic solution Place 2 drops into the right eye 3 (three) times daily. 11/04/16   Molpus, John, MD  ?ibuprofen (ADVIL) 600 MG tablet Take 1 tablet (600 mg total) by mouth every 8  (eight) hours as needed for up to 30 doses for mild pain or moderate pain. 10/21/21   Wyvonnia Dusky, MD  ?ibuprofen (ADVIL) 800 MG tablet Take 1 tablet (800 mg total) by mouth every 8 (eight) hours as needed for moderate pain. 01/24/21   Long, Wonda Olds, MD  ?lidocaine (LIDODERM) 5 % Place 1 patch onto the skin daily. Remove & Discard patch within 12 hours or as directed by MD 10/18/19   Arlean Hopping C, PA-C  ?methocarbamol (ROBAXIN) 750 MG tablet Take 1 tablet (750 mg total) by mouth 2 (two) times daily as needed for muscle spasms (and muscle tightness). 10/18/19   Joy, Shawn C, PA-C  ?ondansetron (ZOFRAN) 4 MG tablet Take 1 tablet (4 mg total) by mouth every 6 (six) hours. 03/08/19   Orpah Greek, MD  ?   ? ?Allergies    ?Patient has no known allergies.   ? ?Review of Systems   ?Review of Systems ? ?Physical Exam ?Updated Vital Signs ?BP (!) 144/101   Pulse 66   Temp 98.2 ?F (36.8 ?C) (Oral)   Resp 16   Ht 6\' 3"  (1.905 m)   Wt 126.6 kg   SpO2 97%   BMI 34.87 kg/m?  ?Physical Exam ?Vitals and nursing note reviewed.  ?Constitutional:   ?   General: He is not in acute distress. ?   Appearance: He is not ill-appearing.  ?HENT:  ?  Head: Atraumatic.  ?Eyes:  ?   Conjunctiva/sclera: Conjunctivae normal.  ?Cardiovascular:  ?   Rate and Rhythm: Normal rate and regular rhythm.  ?   Pulses: Normal pulses.  ?   Heart sounds: No murmur heard. ?Pulmonary:  ?   Effort: Pulmonary effort is normal. No respiratory distress.  ?   Breath sounds: Normal breath sounds.  ?Abdominal:  ?   General: Abdomen is flat. There is no distension.  ?   Palpations: Abdomen is soft.  ?   Tenderness: There is no abdominal tenderness.  ?Musculoskeletal:     ?   General: Normal range of motion.  ?   Cervical back: Normal range of motion.  ?   Comments: Moderate right knee effusion without erythema, fluctuance or warmth.  Nontender to palpation.  Active ROM limited due to swelling and pain.  ?Skin: ?   General: Skin is warm and dry.  ?    Capillary Refill: Capillary refill takes less than 2 seconds.  ?Neurological:  ?   General: No focal deficit present.  ?   Mental Status: He is alert.  ?Psychiatric:     ?   Mood and Affect: Mood normal.  ? ? ?ED Results / Procedures / Treatments   ?Labs ?(all labs ordered are listed, but only abnormal results are displayed) ?Labs Reviewed  ?CBG MONITORING, ED  ? ? ?EKG ?None ? ?Radiology ?DG Knee Complete 4 Views Right ? ?Result Date: 11/22/2021 ?CLINICAL DATA:  Intermittent right knee pain for the past month. EXAM: RIGHT KNEE - COMPLETE 4+ VIEW COMPARISON:  Right knee x-rays dated January 24, 2021. FINDINGS: No acute fracture or dislocation. Unchanged moderate joint effusion. Joint spaces are preserved. Bone mineralization is normal. Soft tissues are unremarkable. IMPRESSION: 1. Unchanged moderate joint effusion. No acute osseous abnormality or significant degenerative changes. Electronically Signed   By: Titus Dubin M.D.   On: 11/22/2021 16:02   ? ?Procedures ?Procedures  ? ? ?Medications Ordered in ED ?Medications - No data to display ? ?ED Course/ Medical Decision Making/ A&P ?  ?                        ?Medical Decision Making ?Amount and/or Complexity of Data Reviewed ?Radiology: ordered. ? ?Risk ?Prescription drug management. ? ? ?History:  ?Per HPI ?Social determinants of health: self pay ? ?Initial impression: ? ?This patient presents to the ED for concern of knee pain and swelling, this involves an extensive number of treatment options, and is a complaint that carries with it a high risk of complications and morbidity.    ? ? ? ?Imaging Studies ordered: ? ?I ordered imaging studies including  ?Right knee x-ray with moderate joint effusion, stable from previous imaging ?I independently visualized and interpreted imaging and I agree with the radiologist interpretation.  ? ? ?ED Course: ?46 year old male in no acute distress, nontoxic-appearing presents to the ED for evaluation of right knee pain and  swelling.  On exam, his vitals are normal.  His right knee has moderate effusion with limited range of motion due to swelling and pain. Concern for gout or septic arthritis is low given that knee is nonerythematous, nontender to palpation.  It is possible that patient continues to have bursitis symptoms.  I had a long conversation with patient about how he needs to be seen by sports medicine physician orthopedic doctor who can better evaluate his knee.  Recommended that he may need an MRI, however this  is not something we would do here in the emergency department.  I did consider obtaining CT, however I have low concern that this is a soft tissue pathology given his clinical exam findings which would be the only real information I would obtain from CT knee.  Additionally, patient is self-pay and may need MRI.  Advised to continue Tylenol and Motrin.  I told patient that he may benefit from injection to the knee, again not something we do here in the emergency department.  Per up-to-date, there have been studies that have used systemic steroids for similar complaints with only modest results.  I discussed this with patient and he would like to try a short course of oral steroids, however I did warn him that this will likely not have the effect that he is looking for.  Recommended again that he follow-up with orthopedic doctor.  Patient expresses understanding and is amenable to plan. ? ?Disposition: ? ?After consideration of the diagnostic results, physical exam, history and the patients response to treatment feel that the patent would benefit from discharge.   ?Effusion of right knee ?Right knee pain: 5-day course of prednisone provided.  Sports medicine referral provided.  Rest of plan and management as described above.  Discharged home in good condition. ? ?Final Clinical Impression(s) / ED Diagnoses ?Final diagnoses:  ?Effusion of right knee  ?Right knee pain, unspecified chronicity  ? ? ?Rx / DC Orders ?ED  Discharge Orders   ? ?      Ordered  ?  predniSONE (DELTASONE) 20 MG tablet  Daily       ? 11/22/21 1640  ? ?  ?  ? ?  ? ? ?  ?Tonye Pearson, [place] ?11/23/21 1019 ? ?  ?Blanchie Dessert, MD ?11/24/21 0012 ? ?

## 2022-11-24 ENCOUNTER — Encounter: Payer: Self-pay | Admitting: *Deleted

## 2023-07-01 ENCOUNTER — Ambulatory Visit: Payer: 59 | Admitting: Family Medicine

## 2023-07-06 ENCOUNTER — Ambulatory Visit: Payer: Self-pay | Admitting: Nurse Practitioner

## 2023-07-07 ENCOUNTER — Encounter: Payer: Self-pay | Admitting: General Practice

## 2023-11-05 ENCOUNTER — Encounter (HOSPITAL_BASED_OUTPATIENT_CLINIC_OR_DEPARTMENT_OTHER): Payer: Self-pay | Admitting: Emergency Medicine

## 2023-11-05 ENCOUNTER — Other Ambulatory Visit: Payer: Self-pay

## 2023-11-05 ENCOUNTER — Emergency Department (HOSPITAL_BASED_OUTPATIENT_CLINIC_OR_DEPARTMENT_OTHER)
Admission: EM | Admit: 2023-11-05 | Discharge: 2023-11-05 | Disposition: A | Discharge status: Home / Self Care | Payer: Self-pay | Attending: Emergency Medicine | Admitting: Emergency Medicine

## 2023-11-05 DIAGNOSIS — R5383 Other fatigue: Secondary | ICD-10-CM | POA: Insufficient documentation

## 2023-11-05 DIAGNOSIS — J069 Acute upper respiratory infection, unspecified: Secondary | ICD-10-CM | POA: Insufficient documentation

## 2023-11-05 DIAGNOSIS — Z20822 Contact with and (suspected) exposure to covid-19: Secondary | ICD-10-CM | POA: Insufficient documentation

## 2023-11-05 LAB — CBC WITH DIFFERENTIAL/PLATELET
Abs Immature Granulocytes: 0.01 10*3/uL (ref 0.00–0.07)
Basophils Absolute: 0 10*3/uL (ref 0.0–0.1)
Basophils Relative: 1 %
Eosinophils Absolute: 0.4 10*3/uL (ref 0.0–0.5)
Eosinophils Relative: 6 %
HCT: 45.9 % (ref 39.0–52.0)
Hemoglobin: 15.2 g/dL (ref 13.0–17.0)
Immature Granulocytes: 0 %
Lymphocytes Relative: 31 %
Lymphs Abs: 1.9 10*3/uL (ref 0.7–4.0)
MCH: 28.5 pg (ref 26.0–34.0)
MCHC: 33.1 g/dL (ref 30.0–36.0)
MCV: 86 fL (ref 80.0–100.0)
Monocytes Absolute: 0.5 10*3/uL (ref 0.1–1.0)
Monocytes Relative: 9 %
Neutro Abs: 3.3 10*3/uL (ref 1.7–7.7)
Neutrophils Relative %: 53 %
Platelets: 287 10*3/uL (ref 150–400)
RBC: 5.34 MIL/uL (ref 4.22–5.81)
RDW: 13.5 % (ref 11.5–15.5)
WBC: 6.1 10*3/uL (ref 4.0–10.5)
nRBC: 0 % (ref 0.0–0.2)

## 2023-11-05 LAB — BASIC METABOLIC PANEL WITH GFR
Anion gap: 11 (ref 5–15)
BUN: 14 mg/dL (ref 6–20)
CO2: 26 mmol/L (ref 22–32)
Calcium: 9 mg/dL (ref 8.9–10.3)
Chloride: 103 mmol/L (ref 98–111)
Creatinine, Ser: 1 mg/dL (ref 0.61–1.24)
GFR, Estimated: >60 mL/min (ref >60)
Glucose, Bld: 109 mg/dL — ABNORMAL HIGH (ref 70–99)
Potassium: 4 mmol/L (ref 3.5–5.1)
Sodium: 140 mmol/L (ref 135–145)

## 2023-11-05 LAB — GROUP A STREP BY PCR: Group A Strep by PCR: NOT DETECTED

## 2023-11-05 LAB — RESP PANEL BY RT-PCR (RSV, FLU A&B, COVID)  RVPGX2
Influenza A by PCR: NEGATIVE
Influenza B by PCR: NEGATIVE
Resp Syncytial Virus by PCR: NEGATIVE
SARS Coronavirus 2 by RT PCR: NEGATIVE

## 2023-11-05 MED ORDER — ACETAMINOPHEN 500 MG PO TABS
1000.0000 mg | ORAL_TABLET | Freq: Once | ORAL | Status: AC
  Administered 2023-11-05: 1000 mg via ORAL
  Filled 2023-11-05: qty 2

## 2023-11-05 NOTE — ED Provider Notes (Signed)
 [facility] Provider Note   CSN: [id number] Arrival date & time: 11/05/23  1510     History  Chief Complaint  Patient presents with   Fatigue    Johnny Lawrence is a 48 y.o. male.  Patient here with bodyaches COVID exposure.  No fever but having pain in the back legs.  No weakness numbness tingling.  Has had a sore throat.  Denies any pain with urination.  Denies any chest pain shortness of breath vision loss.  Mild headaches at times.  Mother tested positive for COVID.  No major medical problems.  The history is provided by the patient.       Home Medications Prior to Admission medications   Medication Sig Start Date End Date Taking? Authorizing Provider  cyclobenzaprine (FLEXERIL) 10 MG tablet Take 1 tablet (10 mg total) by mouth 2 (two) times daily as needed for muscle spasms. 07/21/17   Rosezella Rumpf, PA-C  diclofenac Sodium (VOLTAREN) 1 % GEL Apply 2 g topically 4 (four) times daily as needed. 01/24/21   Long, Arlyss Repress, MD  diphenoxylate-atropine (LOMOTIL) 2.5-0.025 MG tablet Take 1-2 tablets by mouth 4 (four) times daily as needed for diarrhea or loose stools. 03/08/19   Gilda Crease, MD  homatropine 5 % ophthalmic solution Place 2 drops into the right eye 3 (three) times daily. 11/04/16   Molpus, Jonny Ruiz, MD  ibuprofen (ADVIL) 600 MG tablet Take 1 tablet (600 mg total) by mouth every 8 (eight) hours as needed for up to 30 doses for mild pain or moderate pain. 10/21/21   Terald Sleeper, MD  ibuprofen (ADVIL) 800 MG tablet Take 1 tablet (800 mg total) by mouth every 8 (eight) hours as needed for moderate pain. 01/24/21   Long, Arlyss Repress, MD  lidocaine (LIDODERM) 5 % Place 1 patch onto the skin daily. Remove & Discard patch within 12 hours or as directed by MD 10/18/19   Joy, Hillard Danker, PA-C  methocarbamol (ROBAXIN) 750 MG tablet Take 1 tablet (750 mg total) by mouth 2 (two) times daily as needed for muscle spasms (and muscle  tightness). 10/18/19   Joy, Shawn C, PA-C  ondansetron (ZOFRAN) 4 MG tablet Take 1 tablet (4 mg total) by mouth every 6 (six) hours. 03/08/19   Gilda Crease, MD      Allergies    Patient has no known allergies.    Review of Systems   Review of Systems  Physical Exam Updated Vital Signs BP (!) 151/104 (BP Location: Right Arm)   Pulse 73   Temp (!) 97.1 F (36.2 C)   Resp 20   Ht 6\' 3"  (1.905 m)   Wt (!) 140.2 kg   SpO2 94%   BMI 38.62 kg/m  Physical Exam Vitals and nursing note reviewed.  Constitutional:      General: He is not in acute distress.    Appearance: He is well-developed. He is not ill-appearing.  HENT:     Head: Normocephalic and atraumatic.     Mouth/Throat:     Mouth: Mucous membranes are moist.  Eyes:     Extraocular Movements: Extraocular movements intact.     Conjunctiva/sclera: Conjunctivae normal.     Pupils: Pupils are equal, round, and reactive to light.  Cardiovascular:     Rate and Rhythm: Normal rate and regular rhythm.     Pulses: Normal pulses.     Heart sounds: Normal heart sounds. No murmur heard. Pulmonary:  Effort: Pulmonary effort is normal. No respiratory distress.     Breath sounds: Normal breath sounds.  Abdominal:     Palpations: Abdomen is soft.     Tenderness: There is no abdominal tenderness.  Musculoskeletal:        General: No swelling.     Cervical back: Normal range of motion and neck supple.  Skin:    General: Skin is warm and dry.     Capillary Refill: Capillary refill takes less than 2 seconds.  Neurological:     General: No focal deficit present.     Mental Status: He is alert and oriented to person, place, and time.     Cranial Nerves: No cranial nerve deficit.     Sensory: No sensory deficit.     Motor: No weakness.     Coordination: Coordination normal.     Comments: 5+ out of 5 strength throughout, normal sensation, no drift, normal finger-nose-finger, normal speech  Psychiatric:        Mood and  Affect: Mood normal.     ED Results / Procedures / Treatments   Labs (all labs ordered are listed, but only abnormal results are displayed) Labs Reviewed  BASIC METABOLIC PANEL WITH GFR - Abnormal; Notable for the following components:      Result Value   Glucose, Bld 109 (*)    All other components within normal limits  RESP PANEL BY RT-PCR (RSV, FLU A&B, COVID)  [id number]  GROUP A STREP BY PCR  CBC WITH DIFFERENTIAL/PLATELET    EKG None  Radiology No results found.  Procedures Procedures    Medications Ordered in ED Medications  acetaminophen (TYLENOL) tablet 1,000 mg (has no administration in time range)    ED Course/ Medical Decision Making/ A&P                                 Medical Decision Making Amount and/or Complexity of Data Reviewed Labs: ordered.  Risk OTC drugs.   Johnny Lawrence is here with bodyaches COVID exposure.  Normal vitals.  No fever.  Well-appearing.  Neurologically intact.  COVID flu RSV test negative.  Basic labs were obtained CBC BMP which were unremarkable.  I have no concern for stroke ACS PE or other emergent process at this time.  Strep test negative.  I do think that this is likely a viral process.  He is very well-appearing.  Recommend hydration Tylenol ibuprofen.  Is possible that he COVID test is false but he is not having any cough sputum production or fever do not think he has pneumonia.  He is not having any pain with urination.  Ultimately recommend that he return if symptoms worsen but hopefully with rest hydration Tylenol ibuprofen he will improve.  Discharged in good condition.  This chart was dictated using voice recognition software.  Despite best efforts to proofread,  errors can occur which can change the documentation meaning.         Final Clinical Impression(s) / ED Diagnoses Final diagnoses:  Other fatigue  Upper respiratory tract infection, unspecified type    Rx / DC Orders ED Discharge Orders     None          Virgina Norfolk, DO 11/05/23 1704

## 2023-11-05 NOTE — ED Triage Notes (Signed)
 Pt POV steady gait- reports known sick contact for COVID, reports feeling unwell x5-6 days. C/o dry throat, body aches, chills, lower back pain, headache.   Denies cough

## 2023-11-05 NOTE — Discharge Instructions (Signed)
 Recommend 1000 mg of Tylenol every 6 hours as needed for pain body aches.  Recommend 400 mg ibuprofen every 8 hours as needed for pain and bodyaches.
